# Patient Record
Sex: Female | Born: 1940 | Race: White | Hispanic: No | Marital: Married | Smoking: Never smoker
Health system: Southern US, Community
[De-identification: ages and names within clinical notes are randomized; demographics above are authoritative.]

## PROBLEM LIST (undated history)

## (undated) DIAGNOSIS — M1990 Unspecified osteoarthritis, unspecified site: Secondary | ICD-10-CM

## (undated) DIAGNOSIS — I10 Essential (primary) hypertension: Secondary | ICD-10-CM

## (undated) DIAGNOSIS — K219 Gastro-esophageal reflux disease without esophagitis: Secondary | ICD-10-CM

## (undated) DIAGNOSIS — Z87442 Personal history of urinary calculi: Secondary | ICD-10-CM

## (undated) DIAGNOSIS — E039 Hypothyroidism, unspecified: Secondary | ICD-10-CM

## (undated) DIAGNOSIS — Z8719 Personal history of other diseases of the digestive system: Secondary | ICD-10-CM

## (undated) HISTORY — PX: HERNIA REPAIR: SHX51

## (undated) HISTORY — PX: FOOT SURGERY: SHX648

---

## 2006-01-13 HISTORY — PX: CHOLECYSTECTOMY: SHX55

## 2007-06-09 ENCOUNTER — Other Ambulatory Visit: Payer: Self-pay

## 2007-06-09 ENCOUNTER — Inpatient Hospital Stay: Payer: Self-pay | Admitting: Surgery

## 2007-12-05 ENCOUNTER — Ambulatory Visit: Payer: Self-pay | Admitting: Internal Medicine

## 2008-08-14 ENCOUNTER — Ambulatory Visit: Payer: Self-pay | Admitting: Surgery

## 2008-08-15 ENCOUNTER — Ambulatory Visit: Payer: Self-pay | Admitting: Surgery

## 2008-12-09 ENCOUNTER — Ambulatory Visit: Payer: Self-pay | Admitting: Internal Medicine

## 2009-03-05 ENCOUNTER — Ambulatory Visit: Payer: Self-pay | Admitting: General Practice

## 2009-03-27 ENCOUNTER — Ambulatory Visit: Payer: Self-pay | Admitting: General Practice

## 2009-04-08 ENCOUNTER — Ambulatory Visit: Payer: Self-pay | Admitting: General Practice

## 2009-12-10 ENCOUNTER — Ambulatory Visit: Payer: Self-pay | Admitting: Internal Medicine

## 2010-03-02 ENCOUNTER — Ambulatory Visit: Payer: Self-pay | Admitting: Gastroenterology

## 2010-12-23 ENCOUNTER — Ambulatory Visit: Payer: Self-pay | Admitting: Internal Medicine

## 2012-02-08 ENCOUNTER — Ambulatory Visit: Payer: Self-pay | Admitting: Internal Medicine

## 2012-04-14 DIAGNOSIS — I10 Essential (primary) hypertension: Secondary | ICD-10-CM

## 2012-05-14 ENCOUNTER — Ambulatory Visit: Payer: Self-pay | Admitting: General Practice

## 2012-05-14 LAB — URINALYSIS, COMPLETE
Bacteria: NONE SEEN
Bilirubin,UR: NEGATIVE
Blood: NEGATIVE
Glucose,UR: NEGATIVE mg/dL (ref 0–75)
Ketone: NEGATIVE
Protein: NEGATIVE
Specific Gravity: 1.012 (ref 1.003–1.030)
Squamous Epithelial: 1
Transitional Epi: <1

## 2012-05-14 LAB — CBC
HCT: 30 % — ABNORMAL LOW (ref 35.0–47.0)
HGB: 9.2 g/dL — ABNORMAL LOW (ref 12.0–16.0)
MCV: 76 fL — ABNORMAL LOW (ref 80–100)
Platelet: 322 10*3/uL (ref 150–440)
RBC: 3.94 10*6/uL (ref 3.80–5.20)
RDW: 16.5 % — ABNORMAL HIGH (ref 11.5–14.5)
WBC: 7.1 10*3/uL (ref 3.6–11.0)

## 2012-05-14 LAB — BASIC METABOLIC PANEL
Anion Gap: 8 (ref 7–16)
Calcium, Total: 9.4 mg/dL (ref 8.5–10.1)
Chloride: 103 mmol/L (ref 98–107)
Co2: 29 mmol/L (ref 21–32)
EGFR (African American): >60
EGFR (Non-African Amer.): 55 — ABNORMAL LOW
Osmolality: 279 (ref 275–301)
Potassium: 3.8 mmol/L (ref 3.5–5.1)

## 2012-05-14 LAB — PROTIME-INR
INR: 0.9
Prothrombin Time: 12.6 secs (ref 11.5–14.7)

## 2012-05-14 LAB — SEDIMENTATION RATE: Erythrocyte Sed Rate: 30 mm/hr (ref 0–30)

## 2012-07-17 ENCOUNTER — Ambulatory Visit: Payer: Self-pay | Admitting: Gastroenterology

## 2012-08-27 ENCOUNTER — Ambulatory Visit: Payer: Self-pay | Admitting: General Practice

## 2012-08-27 LAB — BASIC METABOLIC PANEL
Co2: 29 mmol/L (ref 21–32)
Creatinine: 0.87 mg/dL (ref 0.60–1.30)
EGFR (African American): >60
Glucose: 84 mg/dL (ref 65–99)
Potassium: 3.9 mmol/L (ref 3.5–5.1)
Sodium: 140 mmol/L (ref 136–145)

## 2012-08-27 LAB — CBC
HCT: 37.7 % (ref 35.0–47.0)
HGB: 12.6 g/dL (ref 12.0–16.0)
MCH: 28.5 pg (ref 26.0–34.0)
MCHC: 33.4 g/dL (ref 32.0–36.0)
MCV: 85 fL (ref 80–100)
RDW: 21.6 % — ABNORMAL HIGH (ref 11.5–14.5)

## 2012-08-27 LAB — URINALYSIS, COMPLETE
Blood: NEGATIVE
Ketone: NEGATIVE
Nitrite: NEGATIVE
Ph: 6 (ref 4.5–8.0)
Protein: NEGATIVE
RBC,UR: 1 /HPF (ref 0–5)
Specific Gravity: 1.006 (ref 1.003–1.030)

## 2012-08-27 LAB — PROTIME-INR
INR: 0.9
Prothrombin Time: 12.7 secs (ref 11.5–14.7)

## 2012-08-28 LAB — URINE CULTURE

## 2012-09-12 ENCOUNTER — Inpatient Hospital Stay: Payer: Self-pay | Admitting: General Practice

## 2012-09-13 LAB — BASIC METABOLIC PANEL
BUN: 7 mg/dL (ref 7–18)
Calcium, Total: 8.3 mg/dL — ABNORMAL LOW (ref 8.5–10.1)
Chloride: 107 mmol/L (ref 98–107)
Co2: 27 mmol/L (ref 21–32)
Glucose: 118 mg/dL — ABNORMAL HIGH (ref 65–99)
Osmolality: 277 (ref 275–301)
Potassium: 3.7 mmol/L (ref 3.5–5.1)
Sodium: 139 mmol/L (ref 136–145)

## 2012-09-13 LAB — PLATELET COUNT: Platelet: 184 10*3/uL (ref 150–440)

## 2012-09-14 LAB — BASIC METABOLIC PANEL
Chloride: 106 mmol/L (ref 98–107)
Co2: 29 mmol/L (ref 21–32)
Creatinine: 0.82 mg/dL (ref 0.60–1.30)
EGFR (African American): >60
EGFR (Non-African Amer.): >60
Glucose: 100 mg/dL — ABNORMAL HIGH (ref 65–99)
Osmolality: 278 (ref 275–301)

## 2012-09-14 LAB — HEMOGLOBIN: HGB: 12.2 g/dL (ref 12.0–16.0)

## 2013-02-11 ENCOUNTER — Ambulatory Visit: Payer: Self-pay | Admitting: Internal Medicine

## 2013-05-13 ENCOUNTER — Ambulatory Visit: Payer: Self-pay | Admitting: General Practice

## 2013-06-20 ENCOUNTER — Ambulatory Visit: Payer: Self-pay | Admitting: General Practice

## 2013-06-28 ENCOUNTER — Ambulatory Visit: Payer: Self-pay | Admitting: General Practice

## 2014-03-25 ENCOUNTER — Ambulatory Visit: Payer: Self-pay | Admitting: Internal Medicine

## 2015-02-10 NOTE — Op Note (Signed)
PATIENT NAME:  Susan Khan, Susan Khan MR#:  [id number] DATE OF BIRTH:  [birth year]  DATE OF PROCEDURE:  09/12/2012  PREOPERATIVE DIAGNOSIS: Degenerative arthrosis of the left knee.   POSTOPERATIVE DIAGNOSIS: Degenerative arthrosis of the left knee.   PROCEDURE PERFORMED: Left total knee arthroplasty using computer-assisted navigation.   SURGEON: Illene LabradorJames P. Angie FavaHooten Jr., MD  ASSISTANT: Van ClinesJon Wolfe, PA-C (required to maintain retraction throughout the procedure)   ANESTHESIA: Femoral nerve block and spinal.   ESTIMATED BLOOD LOSS: 50 mL.   FLUIDS REPLACED: 1800 mL of crystalloid.   TOURNIQUET TIME: 77 minutes.   DRAINS: Two medium drains to reinfusion system.   SOFT TISSUE RELEASES: Anterior cruciate ligament, posterior cruciate ligament, deep medial collateral ligament, and patellofemoral ligament.   IMPLANTS UTILIZED: DePuy PFC Sigma size 2.5 posterior stabilized femoral component (cemented), size 2 MBT tibial component (cemented), 35 mm three peg oval dome patella (cemented), and a 10 mm stabilized rotating platform polyethylene insert.   INDICATIONS FOR SURGERY: The patient is a 74 year old female who has been seen for complaints of progressive left knee pain. X-rays demonstrated severe degenerative changes with relative varus deformity. After discussion of the risks and benefits of surgical intervention, the patient expressed her understanding of the risks and benefits and agreed with plans for surgical intervention.   PROCEDURE IN DETAIL: Patient was brought into the Operating Room and, after adequate femoral nerve block and spinal anesthesia was achieved, a tourniquet was placed on the patient's upper left thigh. Patient's left knee and leg were cleaned and prepped with alcohol and DuraPrep and draped in the usual sterile fashion. A "timeout" was performed as per usual protocol. The left lower extremity was exsanguinated using Esmarch, and the tourniquet was inflated to 300 mmHg. Anterior  longitudinal incision was made followed by a standard mid vastus approach. A moderate effusion was evacuated. The deep fibers of the medial collateral ligament were elevated in subperiosteal fashion off the medial flare of the tibia so as to maintain a continuous soft tissue sleeve. Patella was subluxed laterally and the patellofemoral ligament was incised. Inspection of the knee demonstrated severe degenerative changes with full thickness loss of articular cartilage most notably to the medial compartment. Prominent osteophytes were debrided using rongeur. Anterior and posterior cruciate ligaments were excised. Two 4.0 mm Schanz pins were inserted into the femur and into the tibia for attachment of the ray of trackers used for computer-assisted navigation. Hip center was identified using circumduction technique. Distal landmarks were mapped using computer. Distal femur and proximal tibia were mapped using computer. Distal femoral cutting guide was positioned using computer-assisted navigation so as to achieve a 5 degrees distal valgus cut. Cut was performed and verified using the computer. Distal femur was sized and it was felt that a size 2.5 femoral component was appropriate. A size 2.5 cutting guide was positioned and the anterior cut was performed and verified using computer. This was followed by completion of the posterior and chamfer cuts. Femoral cutting guide for the central box was then positioned and the central box cut was performed.   Attention was then directed to the proximal tibia. Medial and lateral menisci were excised. The extramedullary tibial cutting guide was positioned using computer-assisted navigation so as to achieve a 0 degrees varus valgus alignment and 0 degrees posterior slope. Cut was performed and verified using computer. Proximal tibia was sized and it was felt that a size 2 tibial tray was appropriate. Tibial and femoral trials were inserted followed by insertion of a 10  mm  polyethylene trial. Excellent mediolateral soft tissue balancing was appreciated both in full extension and in flexion. Finally, the patella was cut and prepared so as to accommodate a 35 mm three peg oval dome patella. Patellar trial was placed and the knee was placed through a range of motion with excellent patellar tracking appreciated.   Femoral trial was removed. Central post hole for the tibial component was reamed followed by insertion of a keel punch. Tibial trials were then removed. Cut surfaces of bone were irrigated with copious amounts of normal saline with antibiotic solution using pulsatile lavage and then suctioned dry. Polymethyl methacrylate cement was prepared in the usual fashion using a vacuum mixer. Cement was applied to the cut surface of the proximal tibia as well as along the undersurface of size 2 MBT tibial component. Tibial component was positioned and impacted into place. Excess cement was removed using freer elevators. Cement was then applied to the cut surface of the femur as well as along the posterior flanges of a size 2.5 posterior stabilized femoral component. Femoral component was positioned and impacted into place. Excess cement was removed using freer elevators. A 10 mm polyethylene trial was inserted and the knee was brought into full extension with steady axial compression applied. Finally, cement was applied to the backside of a 35 mm three peg oval dome patella and patellar component was positioned and patellar clamp applied. Excess cement was removed using freer elevators.   After adequate curing of cement, tourniquet was deflated after total tourniquet time of 77 minutes. Hemostasis was achieved using electrocautery. The knee was irrigated with copious amounts of normal saline with antibiotic solution using pulsatile lavage and then suctioned dry. The knee was inspected for any residual cement debris. 30 mL of 0.25% Marcaine with epinephrine was injected along the  posterior capsule. A 10 mm stabilized rotating platform polyethylene insert was inserted and the knee was placed through a range of motion. Excellent patellar tracking was appreciated and excellent mediolateral soft tissue balancing was noted both in extension and in flexion. Two medium drains were placed in the wound bed and brought out through a separate stab incision to be attached to reinfusion system. The medial parapatellar portion of the incision was reapproximated using interrupted sutures of #1 Vicryl. Subcutaneous tissue was approximated in layers using first #0 Vicryl followed by 2-0 Vicryl. Skin was closed with skin staples. A sterile dressing was applied.   Patient tolerated procedure well. She was transported to the recovery room in stable condition.   ____________________________ Illene Labrador. Angie Fava., MD jph:cms D: 09/13/2012 06:21:20 ET T: 09/13/2012 09:29:04 ET JOB#: [id number]  cc: Illene Labrador. Angie Fava., MD, <Dictator>  Illene Labrador Angie Fava MD ELECTRONICALLY SIGNED 09/14/2012 17:59

## 2015-02-10 NOTE — Discharge Summary (Signed)
PATIENT NAME:  Susan Khan, Candiss W MR#:  [id number] DATE OF BIRTH:  02-18-1941  DATE OF ADMISSION:  09/12/2012 DATE OF DISCHARGE:  09/15/2012  ADMITTING DIAGNOSIS: Degenerative arthrosis of left knee.   DISCHARGE DIAGNOSIS: Degenerative arthrosis of left knee.   HISTORY: Patient is a 74 year old pleasant female who has been [facility] for progression of left knee discomfort. Patient was tentatively scheduled to undergo surgery in August but secondary to a decreased hemoglobin this was cancelled until this time. She did have a work-up including colonoscopy. There was no source of decrease in hemoglobin. It was felt to be secondary to her taking NSAIDs and aspirin use. Once she stopped these her hemoglobin returned to normal level.   She states that she had had a remote history of left knee arthroscopy for a meniscectomy as well as chondroplasty. She has had significant increase in her right knee pain over the last several months with episodes of giving way as well as night pain. Patient had localized most of the pain along the medial aspect of the knee. Prior to surgery she was not using any ambulatory aid. Her pain was noted be aggravated with standing and weight-bearing activities. Her internist had requested that she avoid nonsteroidal medications due to the history of gastroesophageal reflux disease. The patient had not seen any significant improvement in her condition despite cortisone injections as well as activity modification. The pain had gotten to the point that it was significantly interfering with her activities of daily living. [facility]  (dictation cut off here) remainder dictated on a different note secondary to this dictation being interupted.  ____________________________ Van ClinesJon Minela [place], PA jrw:cms D: 09/14/2012 07:54:00 ET T: 09/14/2012 12:19:55 ET JOB#: [id number]  cc: Van ClinesJon Nivin Braniff, PA, <Dictator>  Doroteo Nickolson PA ELECTRONICALLY SIGNED  09/22/2012 19:37

## 2015-02-10 NOTE — Consult Note (Signed)
PATIENT NAME:  Susan Khan, Susan Khan MR#:  [id number] DATE OF BIRTH:  [birth year]  DATE OF CONSULTATION:  09/14/2012  REFERRING PHYSICIAN:   CONSULTING PHYSICIAN:  Ramonita Lab, MD  REASON FOR CONSULTATION: Elevated blood pressure.  PRIMARY CARE PHYSICIAN: Dr. Daniel Nones form [facility]  ATTENDING PHYSICIAN: Dr. Claris Gladden from orthopedics    HISTORY OF PRESENT ILLNESS: Patient is a 74 year old pleasant Caucasian female with a past medical history of hyperlipidemia, hypothyroidism, hypertension, gastroesophageal reflux disease and shingles was admitted to orthopedic service for left knee pain and for re-evaluation of the left knee. She was admitted under orthopedic service on 11/20 and she had a left knee replacement done on 11/20. Today is postop day # and patient is tolerating p.o. fine and is drinking plenty of fluids. Hospitalist team is called regarding elevated blood pressure. During my examination patient is resting comfortably, denies any headache or chest pain or shortness of breath. She is reporting that she has past medical history of hypertension and she used to take Zestril which was discontinued by her primary care physician as her blood pressure was normal after she stopped working. She also has mentioned that she was not taking any blood pressure pills for the past 10 years. She is denying any pain in the left knee area but she is feeling that it is tight as it was wrapped in a bandage. Denies any other complaints. She is reporting that her pain is comfortable with the current pain management regimen.   PAST MEDICAL HISTORY: 1. Hypertension, not on any medications. 2. Hypothyroidism. 3. Hyperlipidemia. 4. Gastroesophageal reflux disease. 5. Shingles.    PAST SURGICAL HISTORY:  1. Tonsillectomy. 2. Herniorrhaphy. 3. Left knee replacement done 11/20. 4. Right bunionectomy. 5. Cholecystectomy. 6. Left knee arthroscopy, partial lateral meniscectomy and chondroplasty of  the patellofemoral compartment in June 2010.   PSYCHOSOCIAL HISTORY: Lives with husband. Denies smoking, alcohol or illicit drug usage.   FAMILY HISTORY: Mother had history of chronic obstructive pulmonary disease and breast cancer.   REVIEW OF SYSTEMS: CONSTITUTIONAL: Denies any fatigue, weakness, tiredness. No weight loss or weight gain. HEENT: Denies any blurry vision. Denies any jaundice. Denies any headache or blurry vision. RESPIRATORY: Patient denies any pneumonia, cough, shortness of breath. CARDIO: Patient denies any chest pain, palpitations, arrhythmia, passing out. GASTRO: Patient denies any diarrhea, abdominal pain, constipation. No hematemesis. No bloody stool. NEUROLOGICAL: Denies any history of stroke. Denies any weakness. Denies any passing out. Denies any headache. MUSCULOSKELETAL: Complaining of left knee pain. Denies any back pain or neck pain. SKIN: Denies any cancer, jaundice, lesions. HEMATOLOGIC/LYMPHATIC: Denies any history of anemia, bleeding, cancers. No swollen glands.   PHYSICAL EXAMINATION:  VITAL SIGNS: Blood pressure was initially 186/120 the time of consult and after getting Lopressor 10 mg IV it went down to 162/89 with a pulse of 113, temperature 97.8, pulse 74 to 86, pulse oximetry 97% to 98%, respiratory rate 18 to 20.   GENERAL APPEARANCE: Not in acute distress, resting comfortably.  HEENT: Normocephalic, atraumatic. Pupils are equally reacting to light and accommodation. Moist mucous membranes.   NECK: Supple. No JVD. No carotid bruits. No thyromegaly.   LUNGS: Clear to auscultation bilaterally. No crackles. No wheezing. No rhonchi.   CARDIAC: S1, S2 normal. Regular rate and rhythm. No murmurs. No gallops.   GASTROINTESTINAL: Soft. Bowel sounds are positive in all four quadrants. Nontender, nondistended.   NEUROLOGIC: Awake, alert and oriented x3. Motor and sensory are grossly intact. Tone is normal. Reflexes are  2+.   EXTREMITIES: Left knee is in a  clean bandage. Distal pulses are 2+.   SKIN: No rashes. No lesions. No jaundice. No cyanosis. No clubbing.   LABORATORY, DIAGNOSTIC AND RADIOLOGICAL DATA: From 09/13/2012: Glucose 118, sodium 139, potassium 3.7, chloride 107, CO2 27, anion gap 5, BUN 7, creatinine 0.75, hemoglobin 11.9, platelet count 184,000.   ASSESSMENT AND PLAN: 74 year old female was admitted for left knee replacement and hospitalist team is consulted regarding elevated blood pressure.  1. Hypertensive urgency. Will give her Lopressor 5 mg IV q.6 hours as needed for elevated systolic blood pressure greater than 150. Will start her on low dose metoprolol 25 mg p.o. b.i.d. and titrate as needed. Check BMP in a.m.  2. Status post left knee replacement, postop day #1. Pain management per ortho.   3. History of hypothyroidism. Continue Synthroid.  4. Hyperlipidemia. Continue statin.   The plan of care was discussed in detail with the patient. She is aware of the plan. As patient is eating and drinking well her fluids.   Thank you Dr. Claris Gladdenamasunder for allowing hospitalist team to take care of this patient.   TOTAL TIME SPENT ON THE CONSULTATION: 45 minutes.   ____________________________ Ramonita LabAruna Keiland Pickering, MD ag:cms D: 09/14/2012 01:47:59 ET T: 09/14/2012 10:10:27 ET JOB#: [id number]  cc: Ramonita LabAruna Abrial Arrighi, MD, <Dictator> Lynnea FerrierBert J. Klein III, MD Murlean HarkShalini Ramasunder, MD  Ramonita LabARUNA Vertis Scheib MD ELECTRONICALLY SIGNED 09/19/2012 6:15

## 2015-02-10 NOTE — Discharge Summary (Signed)
PATIENT NAME:  Susan Khan, Susan Khan MR#:  [id number] DATE OF BIRTH:  [birth year]  DATE OF ADMISSION:  09/12/2012 DATE OF DISCHARGE:  09/14/2012  ADDENDUM: Continuation of dictation.  RESULTS: [facility] showed significant narrowing to the medial compartment space. She was noted to have patellofemoral degenerative changes with spurring being noted to the posterior, superior, and inferior poles of the patella. However, the femur appeared to be riding significantly posterior in relation to the tibia, on the lateral view. After discussion of the risks and benefits of surgical intervention, the patient expressed her understanding of the risks and benefits and agreed with plans for surgical intervention.   PROCEDURE: Left total knee arthroplasty using computer-assisted navigation.   ANESTHESIA: Femoral nerve block with spinal.   SOFT TISSUE RELEASE: Anterior cruciate ligament, posterior cruciate ligament, and deep medial collateral ligaments, as well as the patellofemoral ligament.   IMPLANTS UTILIZED: DePuy PFC Sigma size 2.5 posterior stabilized femoral component (cemented), size 2 MBT tibial component (cemented), 35 mm three pegged oval dome patella (cemented), and a 10 mm stabilized rotating platform polyethylene insert.   HOSPITAL COURSE: The patient tolerated the procedure very well. She had no complications. She was then taken to the PAC-U where she was stabilized and then transferred to the orthopedic floor. The patient began receiving anticoagulation therapy of Lovenox 30 mg subcutaneous every 12 hours per anesthesia protocol. She was fitted with TED stockings bilaterally. These were allowed to be removed one hour per eight hour shift. The left one was applied on day two following removal of the Hemovac and dressing change. Her calves have been nontender. There has been no evidence of any DVTs to the lower extremity. Her heels were elevated off the bed using rolled  towels.   The patient has denied any chest pain or shortness of breath. Vital signs have been stable. She has been afebrile. Hemodynamically she was stable. However, during the hospital course, the first night, she was noted to have some elevation of blood pressure. The hospitalist was contacted and she was placed on Lopressor as well as Cozaar. She had been on blood pressure medicine in the past but due to this being under control this was stopped a few years ago. Her family practitioner, Dr. Daniel NonesBert Klein, also consulted the patient. She was asymptomatic during this timeframe.  She had no chest pain or shortness of breath.   Physical therapy was initiated on day one for gait training and transfers. She was ambulating greater than 200 feet upon being discharged. She was able go up and down four sets of steps. She was independent with bed to chair transfers. Occupational therapy was also initiated on day one for activities of daily living and assisted devices.   The patient's IV, Foley, and Hemovac were all discontinued on day two along with a dressing change. The wound was free of any drainage or signs of infection. The Polar Care was reapplied to the surgical leg maintaining a temperature of 40 to 50 degrees Fahrenheit.   DISPOSITION: The patient is being discharged to home in improved stable condition.   DISCHARGE INSTRUCTIONS: She may weight bear as tolerated. Continue using a walker until cleared by physical therapy to go to a quad cane. She will receive home health physical therapy. Continue TED stockings. These are to be worn during the day but may be removed at night. Continue Polar Care maintaining a temperature of 40 to 50 degrees Fahrenheit. She is placed on a regular diet.  She is to return to the clinic in two weeks, sooner if any temperatures of 101.5 or greater or excessive bleeding. She is to resume her regular medication that she was on prior to his admission. She was given a prescription for  oxycodone 5 to 10 mg every 4 to 6 hours p.r.n. for pain, Ultram 50 to 100 mg every 4 to 6 hours p.r.n. for pain, and Lovenox 40 mg daily for two weeks and then discontinue and begin taking one 81 mg enteric-coated aspirin.   PAST MEDICAL HISTORY:  1. Hyperlipidemia.  2. Hypothyroidism.  3. Hypertension.          4. Gastroesophageal reflux disease.  5. Shingles. ____________________________ Van Clines, PA jrw:slb D: 09/14/2012 08:00:08 ET T: 09/14/2012 12:38:38 ET JOB#: [id number]  cc: Van Clines, PA, <Dictator> Matej Sappenfield PA ELECTRONICALLY SIGNED 09/22/2012 19:37

## 2015-02-13 NOTE — Op Note (Signed)
PATIENT NAME:  Susan Khan, Susan Khan MR#:  [id number] DATE OF BIRTH:  18-Oct-1941  DATE OF PROCEDURE:  06/28/2013  PREOPERATIVE DIAGNOSIS: Internal derangement of the right knee.   POSTOPERATIVE DIAGNOSES: 1.  Tear of the posterior horn of the medial meniscus, right knee.  2.  Tear of the posterior horn of the lateral meniscus, right knee.  3.  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   PROCEDURE PERFORMED: Right knee arthroscopy, partial medial and lateral meniscectomies and chondroplasty of the medial and patellofemoral compartments.   SURGEON: Illene Labrador. Hooten, M.D.   ANESTHESIA: General.   ESTIMATED BLOOD LOSS: Minimal.   FLUIDS REPLACED:  700 mL of crystalloid.   DRAINS: None.   TOURNIQUET TIME: Not used.   INDICATIONS FOR SURGERY: The patient is a 74 year old female who has been seen for complaints of persistent right knee pain. MRI demonstrated findings consistent with meniscal pathology. After discussion of the risks and benefits of surgical intervention, the patient expressed understanding of the risks and benefits and agreed with plans for surgical intervention.   PROCEDURE IN DETAIL: The patient was brought into the operating room and after adequate general anesthesia was achieved, a tourniquet was placed on the patient's right thigh and leg was placed in a leg holder. All bony prominences were well padded. The patient's right knee and leg were cleaned and prepped with alcohol and DuraPrep, draped in the usual sterile fashion. A "timeout" was performed as per usual protocol. The anticipated portal sites were injected with 0.25% Marcaine with epinephrine. Anterolateral portal was created and a cannula was inserted. Large effusion was evacuated. The scope was inserted and the knee was distended with fluid using pump. The scope was advanced down the medial gutter into the medial compartment of the knee. Under visualization with the scope, an anteromedial  portal was created and hook probe was inserted. Inspection of the medial compartment demonstrated a degenerative tear of the posterior horn of the medial meniscus. The tear was debrided using meniscal punches and a 4.5 mm shaver. Final contouring of the posterior horn was performed using a 50 degrees ArthroCare wand. The remaining rim of meniscus was visualized and probed and felt to be stable. The anterior horn of the medial meniscus was also probed and felt to be stable. There were grade 3 changes of chondromalacia, involving primarily the medial femoral condyle with lesser changes to the medial tibial plateau. These areas were debrided and contoured using the ArthroCare wand. The scope was then advanced into the intracondylar region. The anterior cruciate ligament was visualized and probed and felt to be stable. The scope was removed from the anterolateral portal and reinserted via the anteromedial portal so as to better visualize the lateral compartment. The articular cartilage was visualized and felt to be in good condition. There was a tear involving the posterior horn of the lateral meniscus which was debrided using meniscal punches and a 4.5 mm shaver with contouring performed using the ArthroCare wand. The anterior horn was visualized and probed and only mild fraying was noted, and this was debrided using the ArthroCare wand. Finally, the scope was positioned so as to visualize the patellofemoral articulation. Areas of grade 3 chondromalacia were noted to the facets of the patella with lesser changes to the trochlear groove. These areas were debrided and contoured using the ArthroCare wand. Good patellar tracking was noted.   The knee was irrigated with copious amounts of fluid and then suctioned dry. The anterolateral portal was reapproximated using #  3-0 nylon. A combination of 0.25% Marcaine with epinephrine and 4 mg morphine was injected via the scope. The scope was removed and the anteromedial portal  was reapproximated using #3-0 nylon. A sterile dressing was applied followed by application of ice wrap.   The patient tolerated the procedure well. She was transported to the recovery room in stable condition.   ____________________________ Illene LabradorJames P. Angie FavaHooten Jr., MD jph:nts D: 06/28/2013 23:35:29 ET T: 06/29/2013 03:59:28 ET JOB#: [id number]  cc: Illene LabradorJames P. Angie FavaHooten Jr., MD, <Dictator> JAMES P Angie FavaHOOTEN JR MD ELECTRONICALLY SIGNED 07/03/2013 7:16

## 2015-03-11 DIAGNOSIS — E039 Hypothyroidism, unspecified: Secondary | ICD-10-CM | POA: Diagnosis not present

## 2015-03-11 DIAGNOSIS — D649 Anemia, unspecified: Secondary | ICD-10-CM | POA: Diagnosis not present

## 2015-03-11 DIAGNOSIS — I10 Essential (primary) hypertension: Secondary | ICD-10-CM | POA: Diagnosis not present

## 2015-03-11 DIAGNOSIS — K219 Gastro-esophageal reflux disease without esophagitis: Secondary | ICD-10-CM | POA: Diagnosis not present

## 2015-03-11 DIAGNOSIS — E785 Hyperlipidemia, unspecified: Secondary | ICD-10-CM | POA: Diagnosis not present

## 2015-03-18 ENCOUNTER — Other Ambulatory Visit: Payer: Self-pay | Admitting: Internal Medicine

## 2015-03-18 DIAGNOSIS — Z0001 Encounter for general adult medical examination with abnormal findings: Secondary | ICD-10-CM | POA: Diagnosis not present

## 2015-03-18 DIAGNOSIS — K219 Gastro-esophageal reflux disease without esophagitis: Secondary | ICD-10-CM | POA: Diagnosis not present

## 2015-03-18 DIAGNOSIS — E038 Other specified hypothyroidism: Secondary | ICD-10-CM | POA: Diagnosis not present

## 2015-03-18 DIAGNOSIS — I10 Essential (primary) hypertension: Secondary | ICD-10-CM | POA: Diagnosis not present

## 2015-03-18 DIAGNOSIS — D649 Anemia, unspecified: Secondary | ICD-10-CM | POA: Diagnosis not present

## 2015-03-18 DIAGNOSIS — Z1231 Encounter for screening mammogram for malignant neoplasm of breast: Secondary | ICD-10-CM

## 2015-04-08 ENCOUNTER — Ambulatory Visit
Admission: RE | Admit: 2015-04-08 | Discharge: 2015-04-08 | Disposition: A | Discharge status: Home / Self Care | Payer: Commercial Managed Care - HMO | Source: Ambulatory Visit | Attending: Internal Medicine | Admitting: Internal Medicine

## 2015-04-08 DIAGNOSIS — Z1231 Encounter for screening mammogram for malignant neoplasm of breast: Secondary | ICD-10-CM | POA: Insufficient documentation

## 2015-04-10 ENCOUNTER — Other Ambulatory Visit: Payer: Self-pay | Admitting: Internal Medicine

## 2015-04-10 DIAGNOSIS — R928 Other abnormal and inconclusive findings on diagnostic imaging of breast: Secondary | ICD-10-CM

## 2015-04-22 ENCOUNTER — Ambulatory Visit: Payer: Commercial Managed Care - HMO

## 2015-04-22 ENCOUNTER — Ambulatory Visit
Admission: RE | Admit: 2015-04-22 | Discharge: 2015-04-22 | Disposition: A | Discharge status: Home / Self Care | Payer: Commercial Managed Care - HMO | Source: Ambulatory Visit | Attending: Internal Medicine | Admitting: Internal Medicine

## 2015-04-22 DIAGNOSIS — N6489 Other specified disorders of breast: Secondary | ICD-10-CM | POA: Diagnosis present

## 2015-04-22 DIAGNOSIS — R928 Other abnormal and inconclusive findings on diagnostic imaging of breast: Secondary | ICD-10-CM | POA: Insufficient documentation

## 2015-04-30 DIAGNOSIS — M1711 Unilateral primary osteoarthritis, right knee: Secondary | ICD-10-CM | POA: Diagnosis not present

## 2015-04-30 DIAGNOSIS — G8929 Other chronic pain: Secondary | ICD-10-CM | POA: Diagnosis not present

## 2015-04-30 DIAGNOSIS — M5416 Radiculopathy, lumbar region: Secondary | ICD-10-CM | POA: Diagnosis not present

## 2015-04-30 DIAGNOSIS — M25561 Pain in right knee: Secondary | ICD-10-CM | POA: Diagnosis not present

## 2015-05-11 ENCOUNTER — Other Ambulatory Visit: Payer: Self-pay | Admitting: Orthopedic Surgery

## 2015-05-11 DIAGNOSIS — M5416 Radiculopathy, lumbar region: Secondary | ICD-10-CM

## 2015-05-18 ENCOUNTER — Ambulatory Visit
Admission: RE | Admit: 2015-05-18 | Discharge: 2015-05-18 | Disposition: A | Discharge status: Home / Self Care | Payer: Commercial Managed Care - HMO | Source: Ambulatory Visit | Attending: Orthopedic Surgery | Admitting: Orthopedic Surgery

## 2015-05-18 DIAGNOSIS — M47896 Other spondylosis, lumbar region: Secondary | ICD-10-CM | POA: Diagnosis not present

## 2015-05-18 DIAGNOSIS — M5137 Other intervertebral disc degeneration, lumbosacral region: Secondary | ICD-10-CM | POA: Diagnosis not present

## 2015-05-18 DIAGNOSIS — M5387 Other specified dorsopathies, lumbosacral region: Secondary | ICD-10-CM | POA: Insufficient documentation

## 2015-05-18 DIAGNOSIS — S33100A Subluxation of unspecified lumbar vertebra, initial encounter: Secondary | ICD-10-CM | POA: Diagnosis not present

## 2015-05-18 DIAGNOSIS — M5386 Other specified dorsopathies, lumbar region: Secondary | ICD-10-CM | POA: Diagnosis not present

## 2015-05-18 DIAGNOSIS — M5136 Other intervertebral disc degeneration, lumbar region: Secondary | ICD-10-CM | POA: Insufficient documentation

## 2015-05-18 DIAGNOSIS — M5416 Radiculopathy, lumbar region: Secondary | ICD-10-CM

## 2015-05-18 DIAGNOSIS — M79604 Pain in right leg: Secondary | ICD-10-CM | POA: Diagnosis present

## 2015-05-18 DIAGNOSIS — M47817 Spondylosis without myelopathy or radiculopathy, lumbosacral region: Secondary | ICD-10-CM | POA: Diagnosis not present

## 2015-05-18 DIAGNOSIS — M4317 Spondylolisthesis, lumbosacral region: Secondary | ICD-10-CM | POA: Diagnosis not present

## 2015-05-18 DIAGNOSIS — M4806 Spinal stenosis, lumbar region: Secondary | ICD-10-CM | POA: Diagnosis not present

## 2015-05-21 DIAGNOSIS — M5416 Radiculopathy, lumbar region: Secondary | ICD-10-CM | POA: Diagnosis not present

## 2015-08-26 DIAGNOSIS — Z23 Encounter for immunization: Secondary | ICD-10-CM | POA: Diagnosis not present

## 2015-09-16 DIAGNOSIS — I10 Essential (primary) hypertension: Secondary | ICD-10-CM | POA: Diagnosis not present

## 2015-09-16 DIAGNOSIS — D649 Anemia, unspecified: Secondary | ICD-10-CM | POA: Diagnosis not present

## 2015-09-23 DIAGNOSIS — I10 Essential (primary) hypertension: Secondary | ICD-10-CM | POA: Diagnosis not present

## 2015-09-23 DIAGNOSIS — D6489 Other specified anemias: Secondary | ICD-10-CM | POA: Diagnosis not present

## 2015-09-23 DIAGNOSIS — K219 Gastro-esophageal reflux disease without esophagitis: Secondary | ICD-10-CM | POA: Diagnosis not present

## 2015-09-23 DIAGNOSIS — M5136 Other intervertebral disc degeneration, lumbar region: Secondary | ICD-10-CM | POA: Insufficient documentation

## 2015-09-23 DIAGNOSIS — E784 Other hyperlipidemia: Secondary | ICD-10-CM | POA: Diagnosis not present

## 2015-09-23 DIAGNOSIS — M150 Primary generalized (osteo)arthritis: Secondary | ICD-10-CM | POA: Diagnosis not present

## 2015-09-23 DIAGNOSIS — E034 Atrophy of thyroid (acquired): Secondary | ICD-10-CM | POA: Diagnosis not present

## 2015-11-29 IMAGING — MG MM DIGITAL SCREENING BILAT W/ CAD
2 series · 7 of 7 positions shown · non-contrast
Comparison: Previous exam(s).

CLINICAL DATA: Screening.

EXAM:
DIGITAL SCREENING BILATERAL MAMMOGRAM WITH CAD

[R CC · right · 6 of 6 slices shown]
[im 1/6]
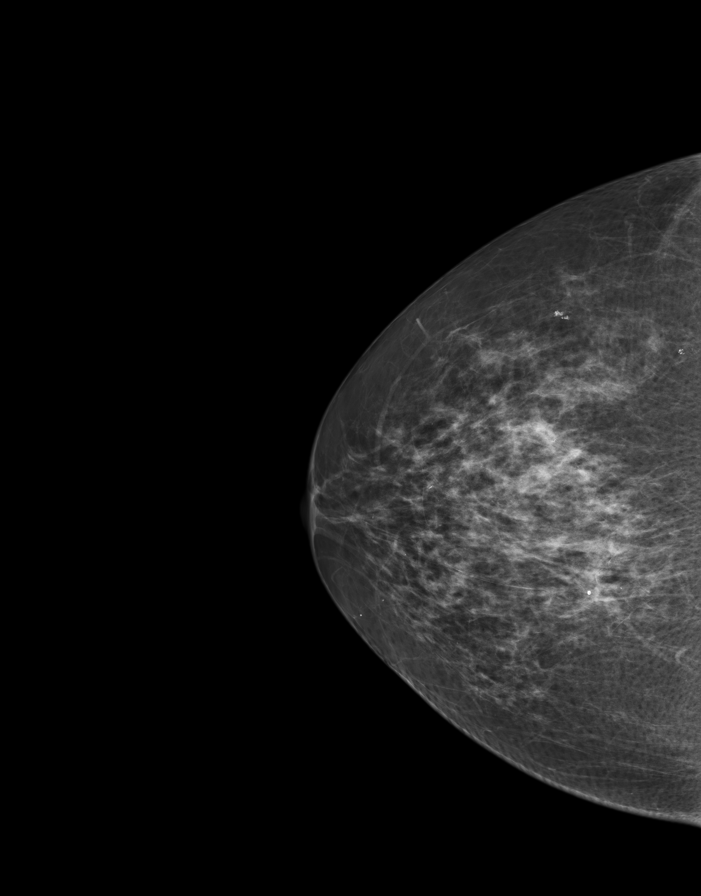
[im 2/6]
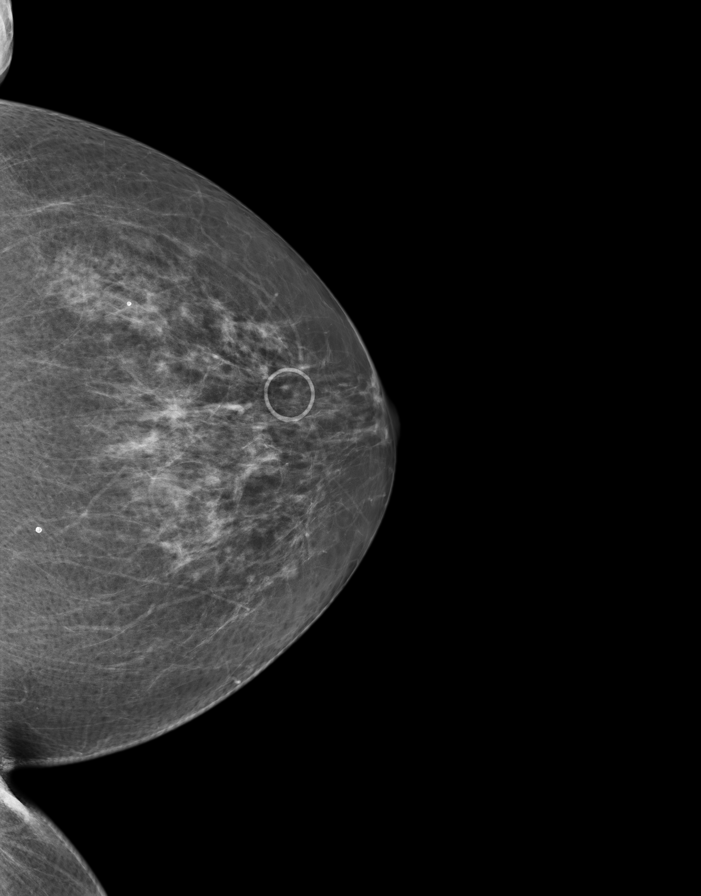
[im 3/6]
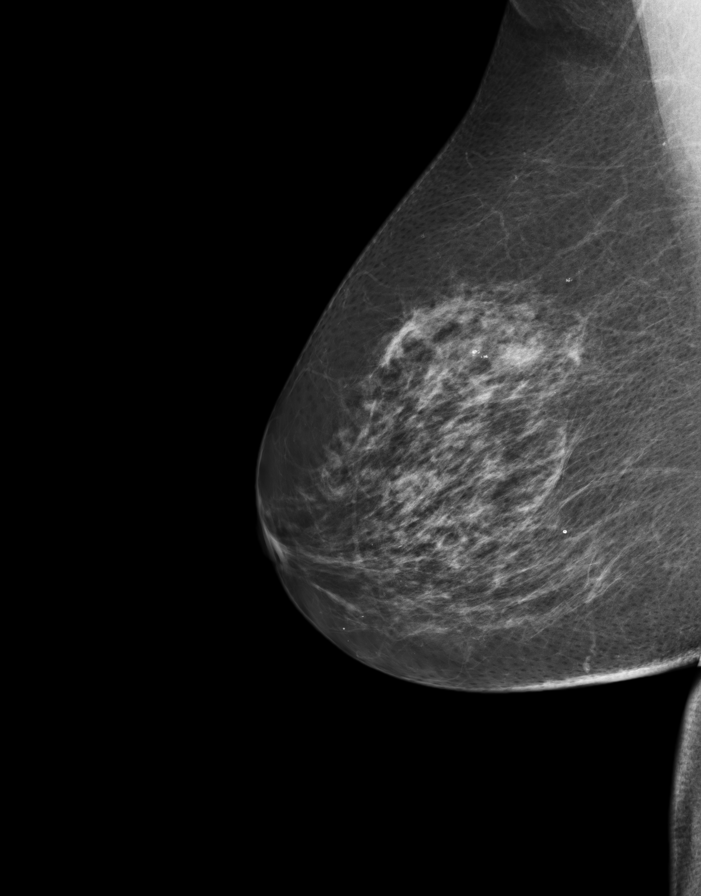
[im 4/6]
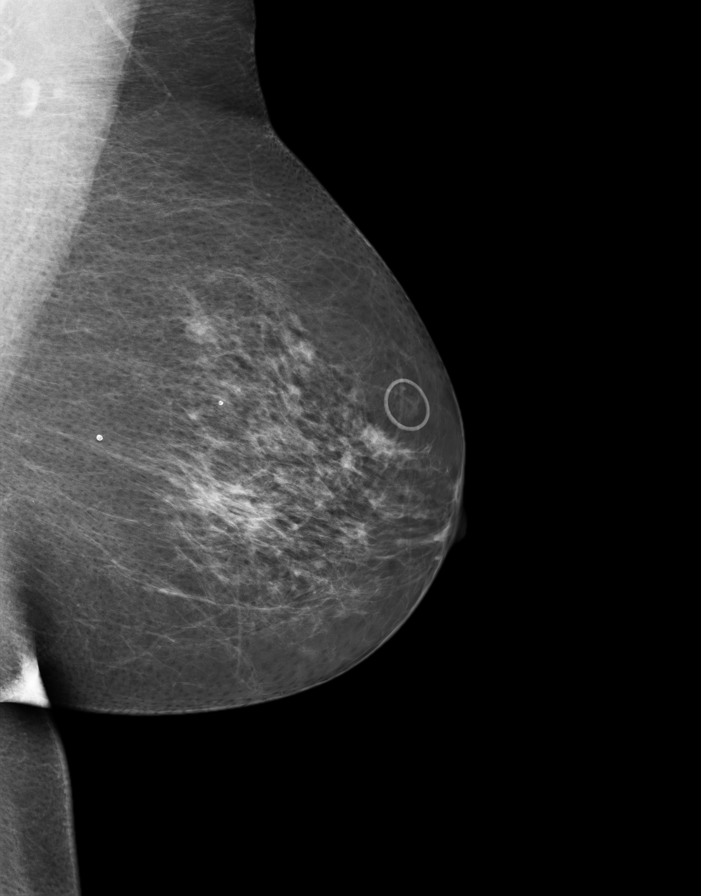
[im 5/6]
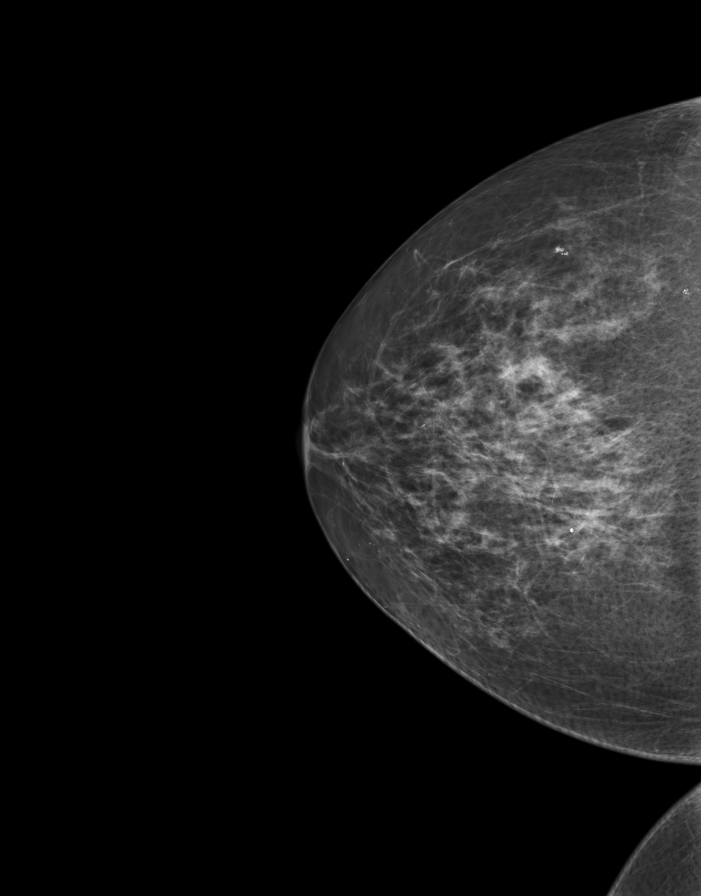
[im 6/6]
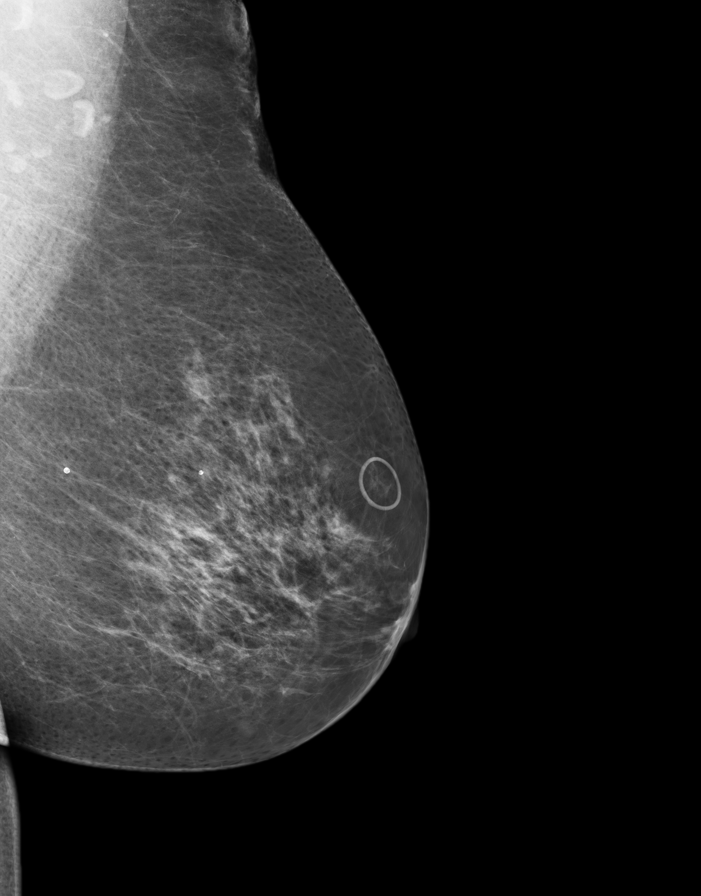

[L CC]
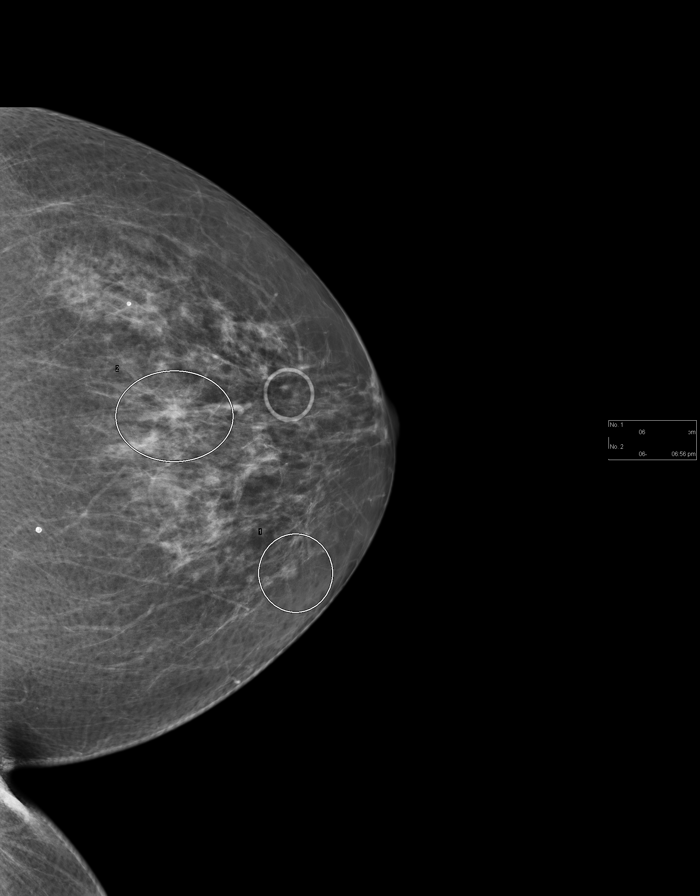

[7 of 7 positions shown; findings below may reference images not displayed]

ACR Breast Density Category b: There are scattered areas of
fibroglandular density.
FINDINGS: In the left breast, possible asymmetries warrant further evaluation.
In the right breast, no findings suspicious for malignancy. Images
were processed with CAD.
IMPRESSION: Further evaluation is suggested for possible asymmetries in the left
breast.

RECOMMENDATION:
Diagnostic mammogram and possibly ultrasound of the left breast.
(Code:AY-[id number])

The patient will be contacted regarding the findings, and additional
imaging will be scheduled.

BI-RADS CATEGORY  0: Incomplete. Need additional imaging evaluation
and/or prior mammograms for comparison.

## 2016-03-16 DIAGNOSIS — D6489 Other specified anemias: Secondary | ICD-10-CM | POA: Diagnosis not present

## 2016-03-16 DIAGNOSIS — I10 Essential (primary) hypertension: Secondary | ICD-10-CM | POA: Diagnosis not present

## 2016-03-23 ENCOUNTER — Other Ambulatory Visit: Payer: Self-pay | Admitting: Internal Medicine

## 2016-03-23 DIAGNOSIS — Z780 Asymptomatic menopausal state: Secondary | ICD-10-CM | POA: Diagnosis not present

## 2016-03-23 DIAGNOSIS — Z1239 Encounter for other screening for malignant neoplasm of breast: Secondary | ICD-10-CM | POA: Diagnosis not present

## 2016-03-23 DIAGNOSIS — E034 Atrophy of thyroid (acquired): Secondary | ICD-10-CM | POA: Diagnosis not present

## 2016-03-23 DIAGNOSIS — K219 Gastro-esophageal reflux disease without esophagitis: Secondary | ICD-10-CM | POA: Diagnosis not present

## 2016-03-23 DIAGNOSIS — M150 Primary generalized (osteo)arthritis: Secondary | ICD-10-CM | POA: Diagnosis not present

## 2016-03-23 DIAGNOSIS — Z1231 Encounter for screening mammogram for malignant neoplasm of breast: Secondary | ICD-10-CM

## 2016-03-23 DIAGNOSIS — E784 Other hyperlipidemia: Secondary | ICD-10-CM | POA: Diagnosis not present

## 2016-03-23 DIAGNOSIS — M5136 Other intervertebral disc degeneration, lumbar region: Secondary | ICD-10-CM | POA: Diagnosis not present

## 2016-03-23 DIAGNOSIS — I10 Essential (primary) hypertension: Secondary | ICD-10-CM | POA: Diagnosis not present

## 2016-03-23 DIAGNOSIS — D6489 Other specified anemias: Secondary | ICD-10-CM | POA: Diagnosis not present

## 2016-03-30 DIAGNOSIS — D6489 Other specified anemias: Secondary | ICD-10-CM | POA: Diagnosis not present

## 2016-04-04 DIAGNOSIS — Z780 Asymptomatic menopausal state: Secondary | ICD-10-CM | POA: Diagnosis not present

## 2016-04-06 DIAGNOSIS — D649 Anemia, unspecified: Secondary | ICD-10-CM | POA: Diagnosis not present

## 2016-04-06 DIAGNOSIS — M85869 Other specified disorders of bone density and structure, unspecified lower leg: Secondary | ICD-10-CM | POA: Insufficient documentation

## 2016-04-06 DIAGNOSIS — D6489 Other specified anemias: Secondary | ICD-10-CM | POA: Diagnosis not present

## 2016-04-08 ENCOUNTER — Other Ambulatory Visit: Payer: Self-pay | Admitting: Internal Medicine

## 2016-04-08 ENCOUNTER — Ambulatory Visit
Admission: RE | Admit: 2016-04-08 | Discharge: 2016-04-08 | Disposition: A | Discharge status: Home / Self Care | Payer: Commercial Managed Care - HMO | Source: Ambulatory Visit | Attending: Internal Medicine | Admitting: Internal Medicine

## 2016-04-08 DIAGNOSIS — Z1231 Encounter for screening mammogram for malignant neoplasm of breast: Secondary | ICD-10-CM

## 2016-05-11 DIAGNOSIS — D6489 Other specified anemias: Secondary | ICD-10-CM | POA: Diagnosis not present

## 2016-07-20 DIAGNOSIS — Z23 Encounter for immunization: Secondary | ICD-10-CM | POA: Diagnosis not present

## 2016-09-14 DIAGNOSIS — D6489 Other specified anemias: Secondary | ICD-10-CM | POA: Diagnosis not present

## 2016-09-14 DIAGNOSIS — I10 Essential (primary) hypertension: Secondary | ICD-10-CM | POA: Diagnosis not present

## 2016-09-21 DIAGNOSIS — I10 Essential (primary) hypertension: Secondary | ICD-10-CM | POA: Diagnosis not present

## 2016-09-21 DIAGNOSIS — M5136 Other intervertebral disc degeneration, lumbar region: Secondary | ICD-10-CM | POA: Diagnosis not present

## 2016-09-21 DIAGNOSIS — K219 Gastro-esophageal reflux disease without esophagitis: Secondary | ICD-10-CM | POA: Diagnosis not present

## 2016-09-21 DIAGNOSIS — Z0000 Encounter for general adult medical examination without abnormal findings: Secondary | ICD-10-CM | POA: Diagnosis not present

## 2016-09-21 DIAGNOSIS — E784 Other hyperlipidemia: Secondary | ICD-10-CM | POA: Diagnosis not present

## 2016-09-21 DIAGNOSIS — D649 Anemia, unspecified: Secondary | ICD-10-CM | POA: Diagnosis not present

## 2016-09-21 DIAGNOSIS — E034 Atrophy of thyroid (acquired): Secondary | ICD-10-CM | POA: Diagnosis not present

## 2016-09-21 DIAGNOSIS — M150 Primary generalized (osteo)arthritis: Secondary | ICD-10-CM | POA: Diagnosis not present

## 2016-11-16 DIAGNOSIS — M25311 Other instability, right shoulder: Secondary | ICD-10-CM | POA: Diagnosis not present

## 2016-11-16 DIAGNOSIS — M19011 Primary osteoarthritis, right shoulder: Secondary | ICD-10-CM | POA: Diagnosis not present

## 2016-11-29 IMAGING — MG MM DIGITAL SCREENING BILAT W/ TOMO W/ CAD
8 of 13 series · 8 of 29 positions shown · non-contrast
Comparison: Previous exam(s).

CLINICAL DATA: Screening.

EXAM:
2D DIGITAL SCREENING BILATERAL MAMMOGRAM WITH CAD AND ADJUNCT TOMO

[R MLO (1 of 2)]
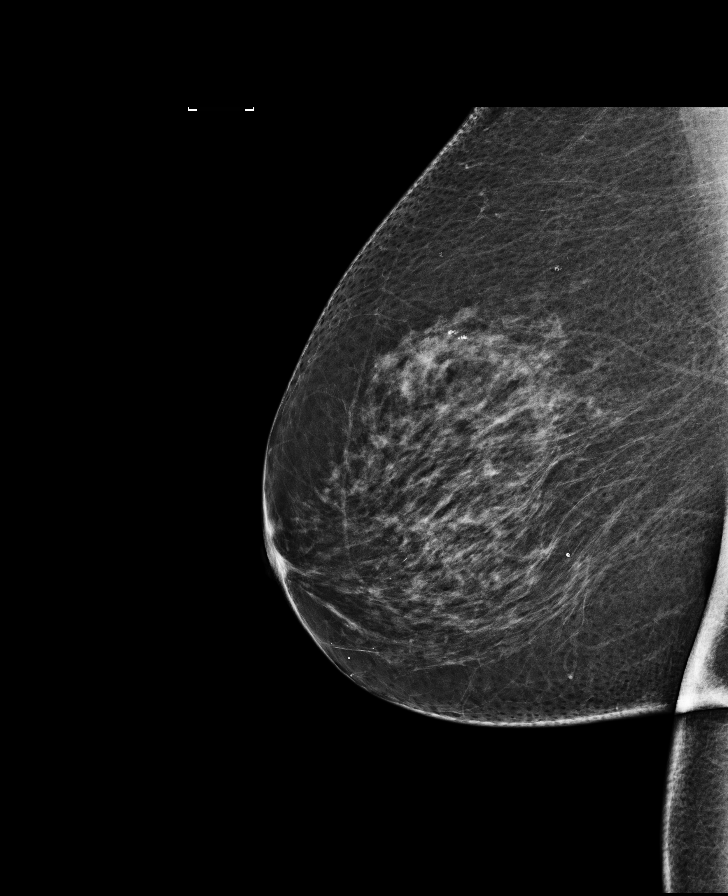

[R MLO (2 of 2)]
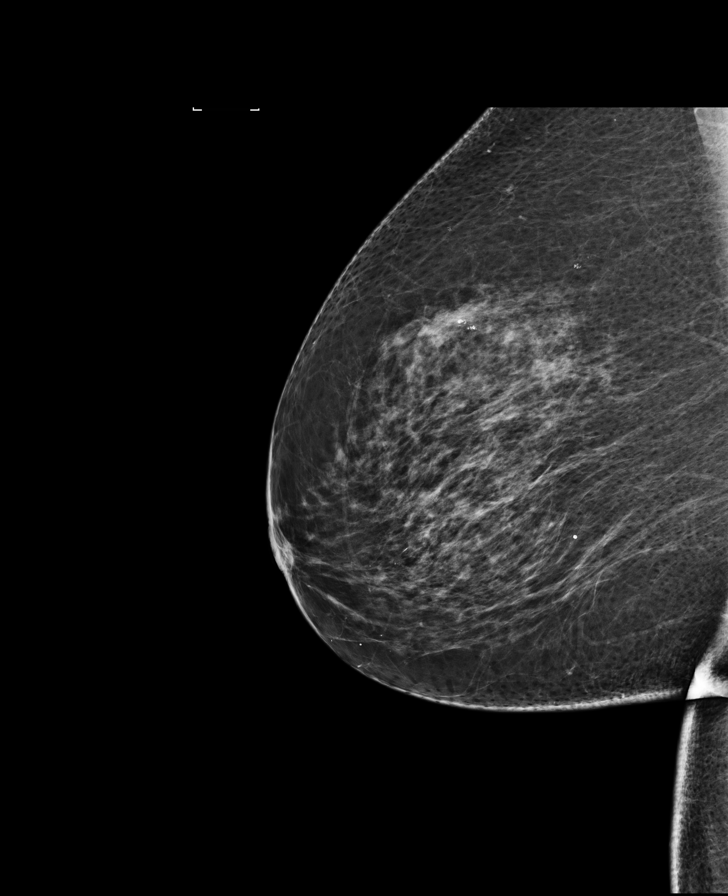

[L CC]
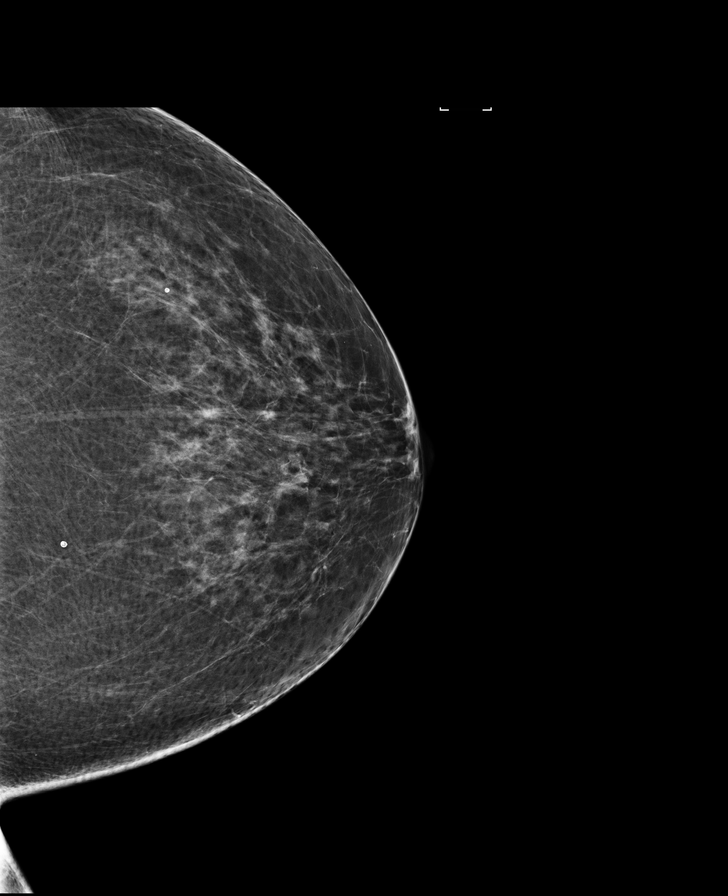

[L MLO]
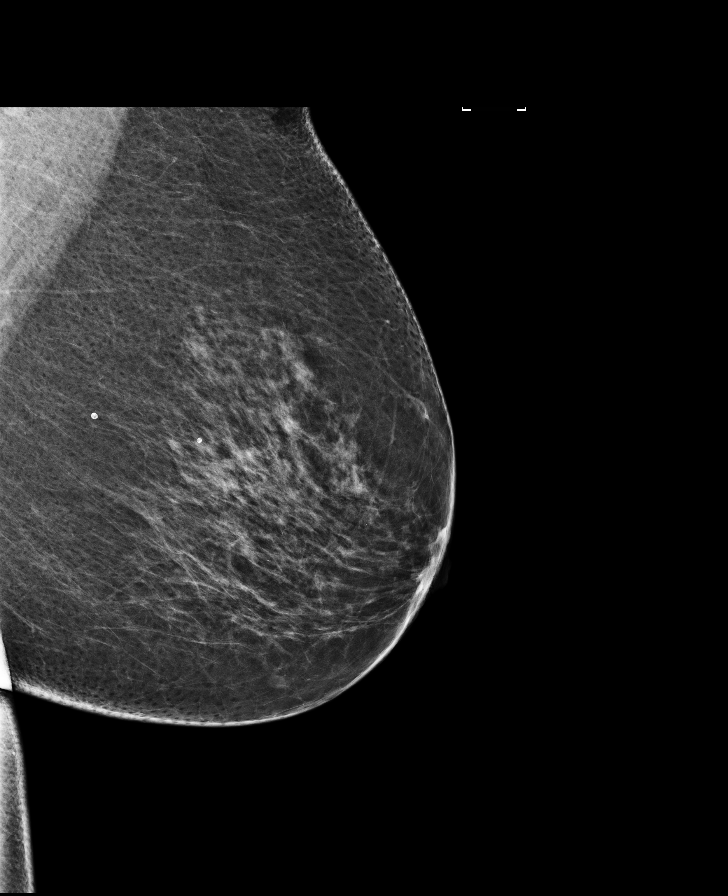

[R CC synth-2D]
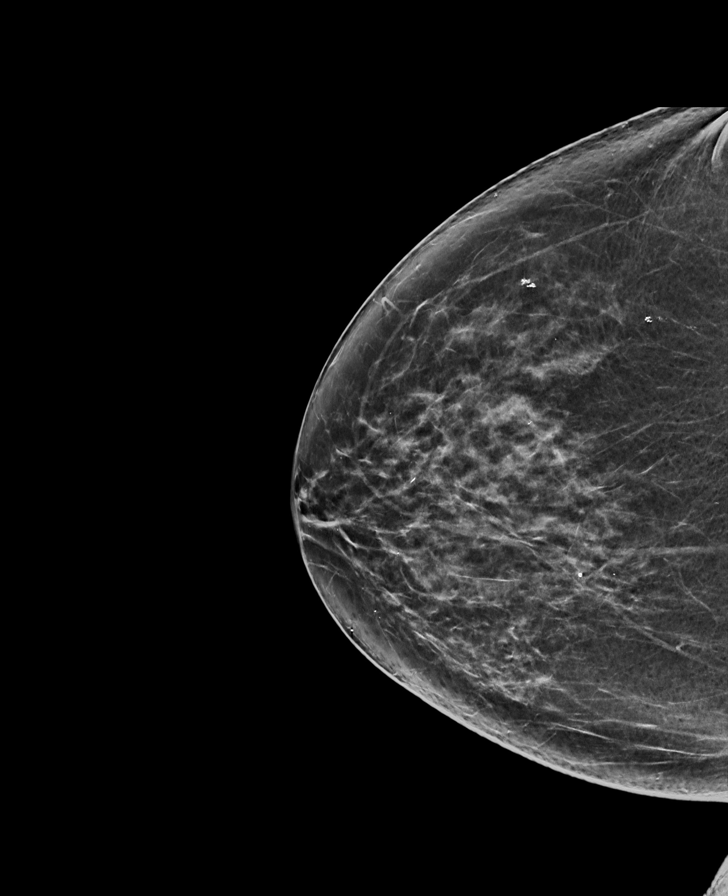

[L CC synth-2D]
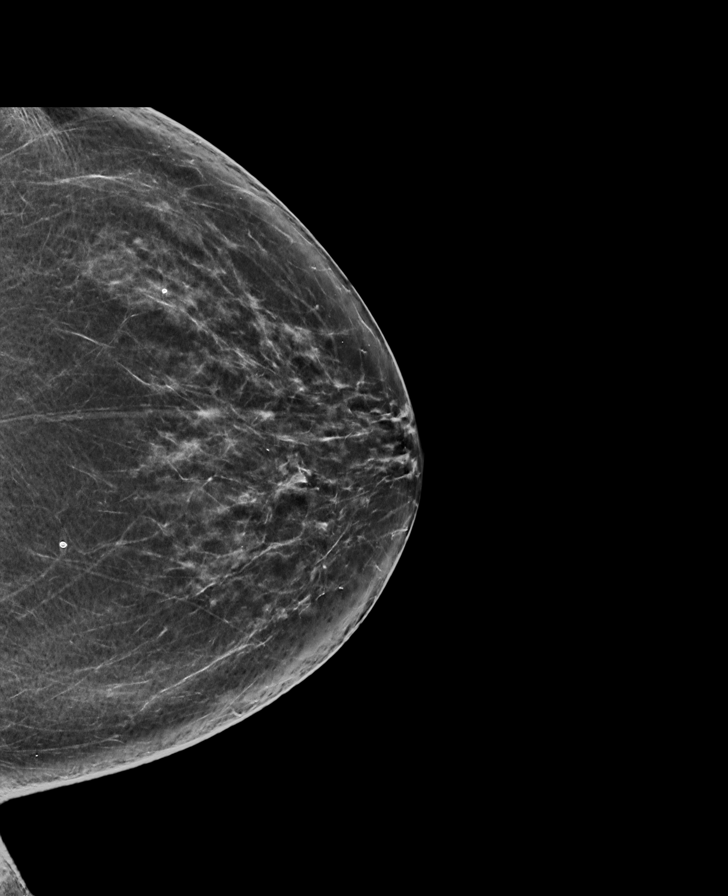

[L MLO synth-2D]
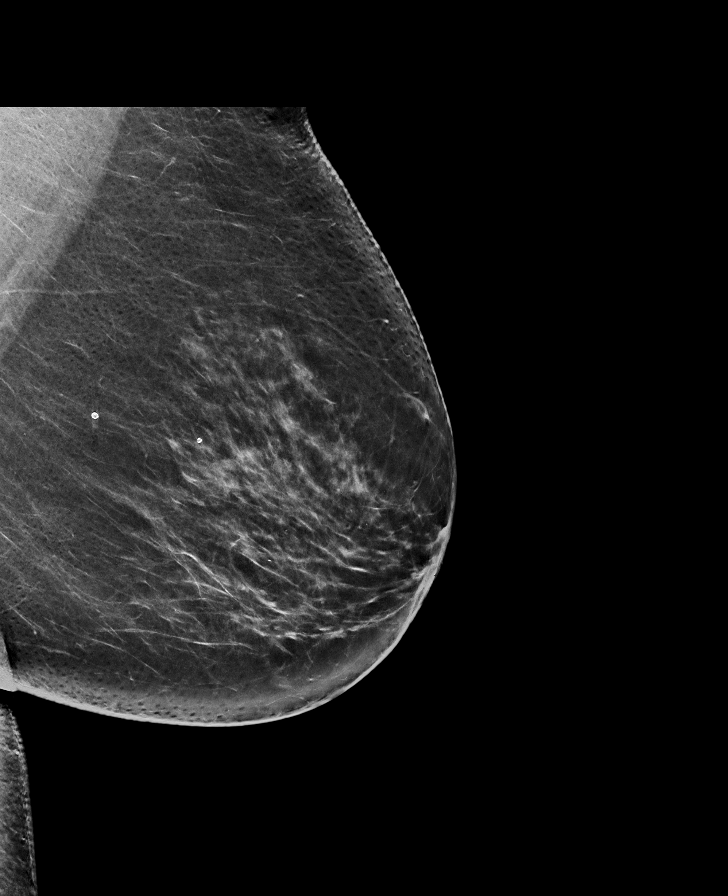

[R MLO synth-2D]
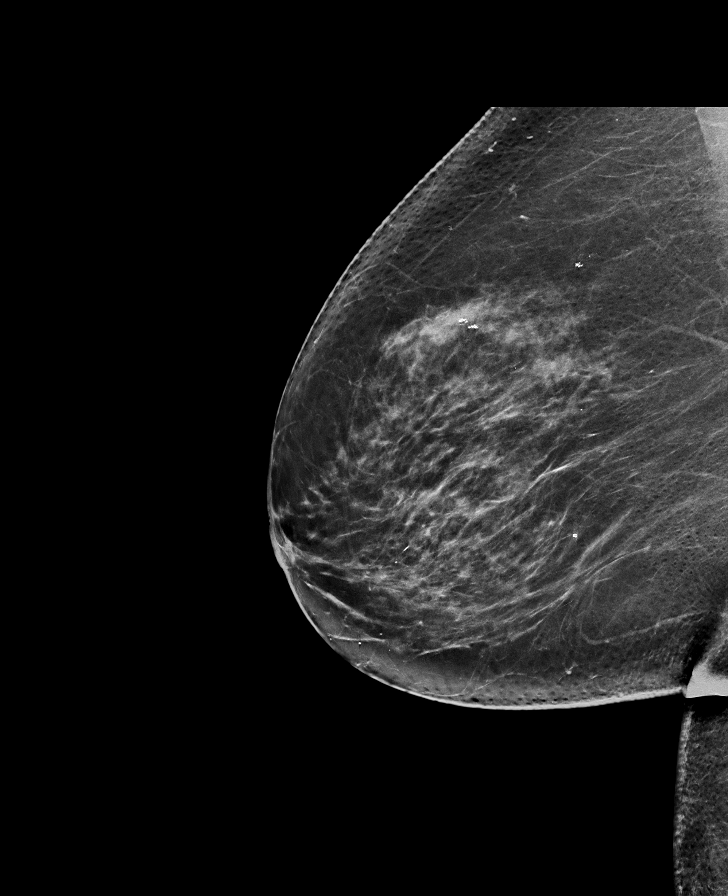

[8 of 29 positions shown; findings below may reference images not displayed]

ACR Breast Density Category b: There are scattered areas of
fibroglandular density.
FINDINGS: There are no findings suspicious for malignancy. Images were
processed with CAD.
IMPRESSION: No mammographic evidence of malignancy. A result letter of this
screening mammogram will be mailed directly to the patient.

RECOMMENDATION:
Screening mammogram in one year. (Code:97-6-RS4)

BI-RADS CATEGORY  1: Negative.

## 2016-12-14 DIAGNOSIS — M19011 Primary osteoarthritis, right shoulder: Secondary | ICD-10-CM | POA: Diagnosis not present

## 2016-12-14 DIAGNOSIS — M25311 Other instability, right shoulder: Secondary | ICD-10-CM | POA: Diagnosis not present

## 2016-12-22 DIAGNOSIS — D649 Anemia, unspecified: Secondary | ICD-10-CM | POA: Diagnosis not present

## 2016-12-22 DIAGNOSIS — E784 Other hyperlipidemia: Secondary | ICD-10-CM | POA: Diagnosis not present

## 2016-12-22 DIAGNOSIS — I10 Essential (primary) hypertension: Secondary | ICD-10-CM | POA: Diagnosis not present

## 2016-12-22 DIAGNOSIS — G8929 Other chronic pain: Secondary | ICD-10-CM | POA: Diagnosis not present

## 2016-12-22 DIAGNOSIS — M25511 Pain in right shoulder: Secondary | ICD-10-CM | POA: Diagnosis not present

## 2016-12-23 DIAGNOSIS — M1711 Unilateral primary osteoarthritis, right knee: Secondary | ICD-10-CM | POA: Diagnosis not present

## 2017-01-04 NOTE — H&P (Signed)
  Susan Khan is an 76 y.o. female.    Chief Complaint: right shoulder pain and weakness  HPI: Pt is a 76 y.o. female complaining of right shoulder pain for multiple years. Pain had continually increased since the beginning. X-rays in the clinic show end-stage arthritic changes of the right shoulder. Pt has tried various conservative treatments which have failed to alleviate their symptoms, including injections and therapy. Various options are discussed with the patient. Risks, benefits and expectations were discussed with the patient. Patient understand the risks, benefits and expectations and wishes to proceed with surgery.   PCP:  Lynnea FerrierBERT J KLEIN III, MD  D/C Plans: Home  PMH: No past medical history on file.  PSH: No past surgical history on file.  Social History:  has no tobacco, alcohol, and drug history on file.  Allergies:  Allergies not on file  Medications: No current facility-administered medications for this encounter.    No current outpatient prescriptions on file.    No results found for this or any previous visit (from the past 48 hour(s)). No results found.  ROS: Pain with rom of the right upper extremity  Physical Exam:  Alert and oriented 76 y.o. female in no acute distress Cranial nerves 2-12 intact Cervical spine: full rom with no tenderness, nv intact distally Chest: active breath sounds bilaterally, no wheeze rhonchi or rales Heart: regular rate and rhythm, no murmur Abd: non tender non distended with active bowel sounds Hip is stable with rom  Right shoulder with limited rom and weakness nv intact distally No rashes or edema  Assessment/Plan Assessment: right shoulder cuff arthropathy  Plan: Patient will undergo a right reverse total shoulder by [name] [facility]. Risks benefits and expectations were discussed with the patient. Patient understand risks, benefits and expectations and wishes to proceed.

## 2017-01-12 NOTE — Pre-Procedure Instructions (Signed)
    Nadara ModeGloria Carol Khan  01/12/2017      [facility] [id number] [place]- [place], [place] - [address] AT [facility] [address] [place] [place] [address] Phone: [phone] Fax: [phone]    Your procedure is scheduled on Fri. Mar. 30 at 730  [facility] at 530 AM.  Call this number if you have problems the morning of surgery: [phone]   Remember:  Do not eat food or drink liquids after midnight.  Take these medicines the morning of surgery with A SIP OF WATER acetaminophen (tylenol), artificial tears, levothyroxine (synthroid), opcon-A eye drops, omeprazole (prilosec).  Stop taking any Vitamins or Herbal Medications. No Aspirin Goody's, BC's, Aleve, Advil, Motrin, Ibuprofen or Fish oil.   Do not wear jewelry, make-up or nail polish.  Do not wear lotions, powders, or perfumes, or deoderant.  Do not shave 48 hours prior to surgery.  Men may shave face and neck.  Do not bring valuables to the hospital.  [facility] is not responsible for any belongings or valuables.  Contacts, dentures or bridgework may not be worn into surgery.  Leave your suitcase in the car.  After surgery it may be brought to your room.  For patients admitted to the hospital, discharge time will be determined by your treatment team.  Patients discharged the day of surgery will not be allowed to drive home.    Special instructions:  See attached  Please read over the following fact sheets that you were given. Pain Booklet, Coughing and Deep Breathing, MRSA Information and Surgical Site Infection Prevention

## 2017-01-13 ENCOUNTER — Encounter (HOSPITAL_COMMUNITY): Payer: Self-pay

## 2017-01-13 ENCOUNTER — Encounter (HOSPITAL_COMMUNITY)
Admission: RE | Admit: 2017-01-13 | Discharge: 2017-01-13 | Disposition: A | Discharge status: Home / Self Care | Payer: Medicare HMO | Source: Ambulatory Visit | Attending: Orthopedic Surgery | Admitting: Orthopedic Surgery

## 2017-01-13 DIAGNOSIS — Z01812 Encounter for preprocedural laboratory examination: Secondary | ICD-10-CM | POA: Insufficient documentation

## 2017-01-13 DIAGNOSIS — M25511 Pain in right shoulder: Secondary | ICD-10-CM | POA: Insufficient documentation

## 2017-01-13 DIAGNOSIS — M19011 Primary osteoarthritis, right shoulder: Secondary | ICD-10-CM | POA: Insufficient documentation

## 2017-01-13 HISTORY — PX: JOINT REPLACEMENT: SHX530

## 2017-01-13 HISTORY — DX: Gastro-esophageal reflux disease without esophagitis: K21.9

## 2017-01-13 HISTORY — DX: Personal history of other diseases of the digestive system: Z87.19

## 2017-01-13 HISTORY — DX: Essential (primary) hypertension: I10

## 2017-01-13 HISTORY — DX: Unspecified osteoarthritis, unspecified site: M19.90

## 2017-01-13 HISTORY — DX: Personal history of urinary calculi: Z87.442

## 2017-01-13 HISTORY — DX: Hypothyroidism, unspecified: E03.9

## 2017-01-13 LAB — BASIC METABOLIC PANEL
ANION GAP: 9 (ref 5–15)
BUN: 9 mg/dL (ref 6–20)
CO2: 27 mmol/L (ref 22–32)
Calcium: 9.7 mg/dL (ref 8.9–10.3)
Chloride: 101 mmol/L (ref 101–111)
Creatinine, Ser: 0.81 mg/dL (ref 0.44–1.00)
Glucose, Bld: 87 mg/dL (ref 65–99)
Potassium: 4.3 mmol/L (ref 3.5–5.1)
Sodium: 137 mmol/L (ref 135–145)

## 2017-01-13 LAB — CBC
HCT: 40.8 % (ref 36.0–46.0)
HEMOGLOBIN: 13.6 g/dL (ref 12.0–15.0)
MCH: 32 pg (ref 26.0–34.0)
MCHC: 33.3 g/dL (ref 30.0–36.0)
MCV: 96 fL (ref 78.0–100.0)
Platelets: 223 10*3/uL (ref 150–400)
RBC: 4.25 MIL/uL (ref 3.87–5.11)
RDW: 13.1 % (ref 11.5–15.5)
WBC: 6.8 10*3/uL (ref 4.0–10.5)

## 2017-01-13 LAB — SURGICAL PCR SCREEN
MRSA, PCR: NEGATIVE
STAPHYLOCOCCUS AUREUS: NEGATIVE

## 2017-01-13 NOTE — Progress Notes (Addendum)
PCP: Dr. Worthy KeelerBurt Khan  Cardiologist:  [facility]  Stress test: pt denies ever  ECHO: pt denies ever  Cardiac cath:pt denies ever   Chest x-ray: pt denies past year

## 2017-01-20 ENCOUNTER — Inpatient Hospital Stay (HOSPITAL_COMMUNITY)
Admission: RE | Admit: 2017-01-20 | Discharge: 2017-01-21 | DRG: 483 | Disposition: A | Discharge status: Home / Self Care | Payer: Medicare HMO | Source: Ambulatory Visit | Attending: Orthopedic Surgery | Admitting: Orthopedic Surgery

## 2017-01-20 ENCOUNTER — Inpatient Hospital Stay (HOSPITAL_COMMUNITY): Payer: Medicare HMO | Admitting: Emergency Medicine

## 2017-01-20 ENCOUNTER — Inpatient Hospital Stay (HOSPITAL_COMMUNITY): Payer: Medicare HMO

## 2017-01-20 ENCOUNTER — Inpatient Hospital Stay (HOSPITAL_COMMUNITY): Payer: Medicare HMO | Admitting: Anesthesiology

## 2017-01-20 ENCOUNTER — Encounter (HOSPITAL_COMMUNITY)
Admission: RE | Disposition: A | Discharge status: Home / Self Care | Payer: Self-pay | Source: Ambulatory Visit | Attending: Orthopedic Surgery

## 2017-01-20 DIAGNOSIS — G8918 Other acute postprocedural pain: Secondary | ICD-10-CM | POA: Diagnosis not present

## 2017-01-20 DIAGNOSIS — M19011 Primary osteoarthritis, right shoulder: Secondary | ICD-10-CM | POA: Diagnosis present

## 2017-01-20 DIAGNOSIS — M75101 Unspecified rotator cuff tear or rupture of right shoulder, not specified as traumatic: Secondary | ICD-10-CM | POA: Diagnosis not present

## 2017-01-20 DIAGNOSIS — Z471 Aftercare following joint replacement surgery: Secondary | ICD-10-CM | POA: Diagnosis not present

## 2017-01-20 DIAGNOSIS — M25711 Osteophyte, right shoulder: Secondary | ICD-10-CM | POA: Diagnosis not present

## 2017-01-20 DIAGNOSIS — Z96651 Presence of right artificial knee joint: Secondary | ICD-10-CM | POA: Diagnosis not present

## 2017-01-20 DIAGNOSIS — M25511 Pain in right shoulder: Secondary | ICD-10-CM | POA: Diagnosis present

## 2017-01-20 DIAGNOSIS — I10 Essential (primary) hypertension: Secondary | ICD-10-CM | POA: Diagnosis not present

## 2017-01-20 DIAGNOSIS — Z96611 Presence of right artificial shoulder joint: Secondary | ICD-10-CM

## 2017-01-20 HISTORY — PX: REVERSE SHOULDER ARTHROPLASTY: SHX5054

## 2017-01-20 SURGERY — ARTHROPLASTY, SHOULDER, TOTAL, REVERSE
Anesthesia: Regional | Site: Shoulder | Laterality: Right

## 2017-01-20 MED ORDER — PANTOPRAZOLE SODIUM 40 MG PO TBEC
80.0000 mg | DELAYED_RELEASE_TABLET | Freq: Every day | ORAL | Status: DC
  Administered 2017-01-21: 80 mg via ORAL
  Filled 2017-01-20: qty 2

## 2017-01-20 MED ORDER — SODIUM CHLORIDE 0.9 % IV SOLN: INTRAVENOUS | Status: DC

## 2017-01-20 MED ORDER — CEFAZOLIN SODIUM-DEXTROSE 2-4 GM/100ML-% IV SOLN
2.0000 g | Freq: Four times a day (QID) | INTRAVENOUS | Status: AC
  Administered 2017-01-20 – 2017-01-21 (×3): 2 g via INTRAVENOUS
  Filled 2017-01-20 (×3): qty 100

## 2017-01-20 MED ORDER — HYDROCODONE-ACETAMINOPHEN 5-325 MG PO TABS: 1.0000 | ORAL_TABLET | ORAL | 0 refills | Status: AC | PRN

## 2017-01-20 MED ORDER — PHENOL 1.4 % MT LIQD: 1.0000 | OROMUCOSAL | Status: DC | PRN

## 2017-01-20 MED ORDER — MENTHOL 3 MG MT LOZG: 1.0000 | LOZENGE | OROMUCOSAL | Status: DC | PRN

## 2017-01-20 MED ORDER — GLYCOPYRROLATE 0.2 MG/ML IJ SOLN
INTRAMUSCULAR | Status: DC | PRN
  Administered 2017-01-20: 0.3 mg via INTRAVENOUS

## 2017-01-20 MED ORDER — LEVOTHYROXINE SODIUM 75 MCG PO TABS
75.0000 ug | ORAL_TABLET | Freq: Every day | ORAL | Status: DC
  Administered 2017-01-21: 75 ug via ORAL
  Filled 2017-01-20: qty 1

## 2017-01-20 MED ORDER — ACETAMINOPHEN 500 MG PO TABS
500.0000 mg | ORAL_TABLET | Freq: Two times a day (BID) | ORAL | Status: DC
  Administered 2017-01-20 – 2017-01-21 (×2): 500 mg via ORAL
  Filled 2017-01-20 (×2): qty 1

## 2017-01-20 MED ORDER — HYDROCODONE-ACETAMINOPHEN 5-325 MG PO TABS
1.0000 | ORAL_TABLET | ORAL | Status: DC | PRN
  Administered 2017-01-20 – 2017-01-21 (×2): 1 via ORAL
  Filled 2017-01-20 (×2): qty 1

## 2017-01-20 MED ORDER — ADULT MULTIVITAMIN W/MINERALS CH
1.0000 | ORAL_TABLET | Freq: Every day | ORAL | Status: DC
  Administered 2017-01-21: 1 via ORAL
  Filled 2017-01-20: qty 1

## 2017-01-20 MED ORDER — VITAMIN D 1000 UNITS PO TABS
1000.0000 [IU] | ORAL_TABLET | Freq: Every day | ORAL | Status: DC
  Administered 2017-01-21: 1000 [IU] via ORAL
  Filled 2017-01-20: qty 1

## 2017-01-20 MED ORDER — EPINEPHRINE PF 1 MG/ML IJ SOLN
INTRAMUSCULAR | Status: AC
Start: 2017-01-20 — End: 2017-01-20
  Filled 2017-01-20: qty 1

## 2017-01-20 MED ORDER — STERILE WATER FOR IRRIGATION IR SOLN
Status: DC | PRN
  Administered 2017-01-20: 1000 mL

## 2017-01-20 MED ORDER — POLYETHYLENE GLYCOL 3350 17 G PO PACK: 17.0000 g | PACK | Freq: Every day | ORAL | Status: DC | PRN

## 2017-01-20 MED ORDER — ONDANSETRON HCL 4 MG/2ML IJ SOLN
INTRAMUSCULAR | Status: DC | PRN
  Administered 2017-01-20: 4 mg via INTRAVENOUS

## 2017-01-20 MED ORDER — ACETAMINOPHEN 650 MG RE SUPP: 650.0000 mg | Freq: Four times a day (QID) | RECTAL | Status: DC | PRN

## 2017-01-20 MED ORDER — PRAVASTATIN SODIUM 40 MG PO TABS
40.0000 mg | ORAL_TABLET | Freq: Every day | ORAL | Status: DC
  Administered 2017-01-20: 40 mg via ORAL
  Filled 2017-01-20 (×2): qty 1

## 2017-01-20 MED ORDER — FLUOROMETHOLONE 0.1 % OP SUSP
1.0000 [drp] | Freq: Every day | OPHTHALMIC | Status: DC | PRN
  Filled 2017-01-20: qty 5

## 2017-01-20 MED ORDER — NAPHAZOLINE-PHENIRAMINE 0.027-0.315 % OP SOLN
1.0000 [drp] | Freq: Every day | OPHTHALMIC | Status: DC | PRN
  Filled 2017-01-20: qty 15

## 2017-01-20 MED ORDER — MIDAZOLAM HCL 2 MG/2ML IJ SOLN
INTRAMUSCULAR | Status: AC
Start: 2017-01-20 — End: 2017-01-20
  Filled 2017-01-20: qty 2

## 2017-01-20 MED ORDER — ONDANSETRON HCL 4 MG/2ML IJ SOLN: 4.0000 mg | Freq: Four times a day (QID) | INTRAMUSCULAR | Status: DC | PRN

## 2017-01-20 MED ORDER — DEXAMETHASONE SODIUM PHOSPHATE 10 MG/ML IJ SOLN
INTRAMUSCULAR | Status: DC | PRN
  Administered 2017-01-20: 10 mg via INTRAVENOUS

## 2017-01-20 MED ORDER — PHENYLEPHRINE HCL 10 MG/ML IJ SOLN
INTRAVENOUS | Status: DC | PRN
  Administered 2017-01-20: 50 ug/min via INTRAVENOUS

## 2017-01-20 MED ORDER — METOCLOPRAMIDE HCL 5 MG PO TABS: 5.0000 mg | ORAL_TABLET | Freq: Three times a day (TID) | ORAL | Status: DC | PRN

## 2017-01-20 MED ORDER — CEFAZOLIN SODIUM-DEXTROSE 2-4 GM/100ML-% IV SOLN
INTRAVENOUS | Status: AC
  Filled 2017-01-20: qty 100

## 2017-01-20 MED ORDER — LACTATED RINGERS IV SOLN
INTRAVENOUS | Status: DC | PRN
  Administered 2017-01-20 (×2): via INTRAVENOUS

## 2017-01-20 MED ORDER — DOCUSATE SODIUM 100 MG PO CAPS
100.0000 mg | ORAL_CAPSULE | Freq: Two times a day (BID) | ORAL | Status: DC
  Administered 2017-01-20 – 2017-01-21 (×2): 100 mg via ORAL
  Filled 2017-01-20 (×2): qty 1

## 2017-01-20 MED ORDER — FENTANYL CITRATE (PF) 250 MCG/5ML IJ SOLN
INTRAMUSCULAR | Status: AC
  Filled 2017-01-20: qty 5

## 2017-01-20 MED ORDER — METOCLOPRAMIDE HCL 5 MG/ML IJ SOLN: 5.0000 mg | Freq: Three times a day (TID) | INTRAMUSCULAR | Status: DC | PRN

## 2017-01-20 MED ORDER — ROPIVACAINE HCL 5 MG/ML IJ SOLN
INTRAMUSCULAR | Status: DC | PRN
  Administered 2017-01-20: 30 mL via PERINEURAL

## 2017-01-20 MED ORDER — ACETAMINOPHEN 325 MG PO TABS: 650.0000 mg | ORAL_TABLET | Freq: Four times a day (QID) | ORAL | Status: DC | PRN

## 2017-01-20 MED ORDER — BUPIVACAINE HCL (PF) 0.25 % IJ SOLN
INTRAMUSCULAR | Status: AC
  Filled 2017-01-20: qty 30

## 2017-01-20 MED ORDER — PROPOFOL 10 MG/ML IV BOLUS
INTRAVENOUS | Status: DC | PRN
  Administered 2017-01-20: 130 mg via INTRAVENOUS

## 2017-01-20 MED ORDER — ONDANSETRON HCL 4 MG PO TABS: 4.0000 mg | ORAL_TABLET | Freq: Four times a day (QID) | ORAL | Status: DC | PRN

## 2017-01-20 MED ORDER — FENTANYL CITRATE (PF) 250 MCG/5ML IJ SOLN
INTRAMUSCULAR | Status: DC | PRN
  Administered 2017-01-20: 100 ug via INTRAVENOUS

## 2017-01-20 MED ORDER — PROPOFOL 10 MG/ML IV BOLUS
INTRAVENOUS | Status: AC
  Filled 2017-01-20: qty 20

## 2017-01-20 MED ORDER — LIDOCAINE HCL (CARDIAC) 20 MG/ML IV SOLN
INTRAVENOUS | Status: DC | PRN
  Administered 2017-01-20: 50 mg via INTRATRACHEAL

## 2017-01-20 MED ORDER — CEFAZOLIN SODIUM-DEXTROSE 2-4 GM/100ML-% IV SOLN
2.0000 g | INTRAVENOUS | Status: AC
  Administered 2017-01-20: 2 g via INTRAVENOUS

## 2017-01-20 MED ORDER — 0.9 % SODIUM CHLORIDE (POUR BTL) OPTIME
TOPICAL | Status: DC | PRN
  Administered 2017-01-20: 1000 mL

## 2017-01-20 MED ORDER — BUPIVACAINE-EPINEPHRINE 0.25% -1:200000 IJ SOLN
INTRAMUSCULAR | Status: DC | PRN
  Administered 2017-01-20: 10 mL

## 2017-01-20 MED ORDER — SUGAMMADEX SODIUM 200 MG/2ML IV SOLN
INTRAVENOUS | Status: DC | PRN
Start: 2017-01-20 — End: 2017-01-20
  Administered 2017-01-20: 200 mg via INTRAVENOUS

## 2017-01-20 MED ORDER — ROCURONIUM BROMIDE 100 MG/10ML IV SOLN
INTRAVENOUS | Status: DC | PRN
  Administered 2017-01-20: 30 mg via INTRAVENOUS

## 2017-01-20 MED ORDER — CHLORHEXIDINE GLUCONATE 4 % EX LIQD: 60.0000 mL | Freq: Once | CUTANEOUS | Status: DC

## 2017-01-20 MED ORDER — MORPHINE SULFATE (PF) 2 MG/ML IV SOLN
2.0000 mg | INTRAVENOUS | Status: DC | PRN
  Filled 2017-01-20: qty 1

## 2017-01-20 MED ORDER — MIDAZOLAM HCL 5 MG/5ML IJ SOLN
INTRAMUSCULAR | Status: DC | PRN
  Administered 2017-01-20: 2 mg via INTRAVENOUS

## 2017-01-20 MED ORDER — LOSARTAN POTASSIUM 25 MG PO TABS
25.0000 mg | ORAL_TABLET | Freq: Every day | ORAL | Status: DC
  Administered 2017-01-20: 25 mg via ORAL
  Filled 2017-01-20: qty 1

## 2017-01-20 SURGICAL SUPPLY — 74 items
BIT DRILL 170X2.5X (BIT) ×1 IMPLANT
BIT DRILL 5/64X5 DISP (BIT) IMPLANT
BIT DRL 170X2.5X (BIT) ×1
BLADE SAG 18X100X1.27 (BLADE) ×3 IMPLANT
CAPT SHLDR REVTOTAL 1 ×3 IMPLANT
CLOSURE STERI-STRIP 1/2X4 (GAUZE/BANDAGES/DRESSINGS) ×1
CLOSURE WOUND 1/2 X4 (GAUZE/BANDAGES/DRESSINGS) ×1
CLSR STERI-STRIP ANTIMIC 1/2X4 (GAUZE/BANDAGES/DRESSINGS) ×2 IMPLANT
COVER SURGICAL LIGHT HANDLE (MISCELLANEOUS) ×3 IMPLANT
DRAPE IMP U-DRAPE 54X76 (DRAPES) ×6 IMPLANT
DRAPE INCISE IOBAN 66X45 STRL (DRAPES) ×3 IMPLANT
DRAPE ORTHO SPLIT 77X108 STRL (DRAPES) ×4
DRAPE SURG ORHT 6 SPLT 77X108 (DRAPES) ×2 IMPLANT
DRAPE U-SHAPE 47X51 STRL (DRAPES) ×3 IMPLANT
DRAPE X-RAY CASS 24X20 (DRAPES) IMPLANT
DRILL 2.5 (BIT) ×2
DRSG ADAPTIC 3X8 NADH LF (GAUZE/BANDAGES/DRESSINGS) ×3 IMPLANT
DRSG PAD ABDOMINAL 8X10 ST (GAUZE/BANDAGES/DRESSINGS) ×3 IMPLANT
DURAPREP 26ML APPLICATOR (WOUND CARE) ×3 IMPLANT
ELECT BLADE 4.0 EZ CLEAN MEGAD (MISCELLANEOUS) ×3
ELECT NEEDLE TIP 2.8 STRL (NEEDLE) ×3 IMPLANT
ELECT REM PT RETURN 9FT ADLT (ELECTROSURGICAL) ×3
ELECTRODE BLDE 4.0 EZ CLN MEGD (MISCELLANEOUS) ×1 IMPLANT
ELECTRODE REM PT RTRN 9FT ADLT (ELECTROSURGICAL) ×1 IMPLANT
FILTER STRAW FLUID ASPIR (MISCELLANEOUS) ×3 IMPLANT
GAUZE SPONGE 4X4 12PLY STRL (GAUZE/BANDAGES/DRESSINGS) ×3 IMPLANT
GAUZE SPONGE 4X4 16PLY XRAY LF (GAUZE/BANDAGES/DRESSINGS) ×3 IMPLANT
GLOVE BIOGEL PI ORTHO PRO 7.5 (GLOVE) ×2
GLOVE BIOGEL PI ORTHO PRO SZ8 (GLOVE) ×2
GLOVE ORTHO TXT STRL SZ7.5 (GLOVE) ×3 IMPLANT
GLOVE PI ORTHO PRO STRL 7.5 (GLOVE) ×1 IMPLANT
GLOVE PI ORTHO PRO STRL SZ8 (GLOVE) ×1 IMPLANT
GLOVE SURG ORTHO 8.5 STRL (GLOVE) ×3 IMPLANT
GOWN STRL REUS W/ TWL LRG LVL3 (GOWN DISPOSABLE) ×1 IMPLANT
GOWN STRL REUS W/ TWL XL LVL3 (GOWN DISPOSABLE) ×2 IMPLANT
GOWN STRL REUS W/TWL LRG LVL3 (GOWN DISPOSABLE) ×2
GOWN STRL REUS W/TWL XL LVL3 (GOWN DISPOSABLE) ×4
HANDPIECE INTERPULSE COAX TIP (DISPOSABLE)
KIT BASIN OR (CUSTOM PROCEDURE TRAY) ×3 IMPLANT
KIT ROOM TURNOVER OR (KITS) ×3 IMPLANT
MANIFOLD NEPTUNE II (INSTRUMENTS) ×3 IMPLANT
NEEDLE 1/2 CIR MAYO (NEEDLE) ×3 IMPLANT
NEEDLE HYPO 25GX1X1/2 BEV (NEEDLE) ×3 IMPLANT
NS IRRIG 1000ML POUR BTL (IV SOLUTION) ×3 IMPLANT
PACK SHOULDER (CUSTOM PROCEDURE TRAY) ×3 IMPLANT
PAD ARMBOARD 7.5X6 YLW CONV (MISCELLANEOUS) ×6 IMPLANT
PIN GUIDE 1.2 (PIN) ×3 IMPLANT
PIN GUIDE GLENOPHERE 1.5MX300M (PIN) IMPLANT
PIN METAGLENE 2.5 (PIN) IMPLANT
SET HNDPC FAN SPRY TIP SCT (DISPOSABLE) IMPLANT
SLING ARM IMMOBILIZER LRG (SOFTGOODS) ×3 IMPLANT
SLING ARM LRG ADULT FOAM STRAP (SOFTGOODS) IMPLANT
SLING ARM MED ADULT FOAM STRAP (SOFTGOODS) IMPLANT
SPONGE LAP 18X18 X RAY DECT (DISPOSABLE) IMPLANT
SPONGE LAP 4X18 X RAY DECT (DISPOSABLE) ×3 IMPLANT
STRIP CLOSURE SKIN 1/2X4 (GAUZE/BANDAGES/DRESSINGS) ×2 IMPLANT
SUCTION FRAZIER HANDLE 10FR (MISCELLANEOUS) ×2
SUCTION TUBE FRAZIER 10FR DISP (MISCELLANEOUS) ×1 IMPLANT
SUT FIBERWIRE #2 38 T-5 BLUE (SUTURE) ×18
SUT MNCRL AB 4-0 PS2 18 (SUTURE) ×3 IMPLANT
SUT VIC AB 0 CT2 27 (SUTURE) ×3 IMPLANT
SUT VIC AB 2-0 CT1 27 (SUTURE) ×2
SUT VIC AB 2-0 CT1 TAPERPNT 27 (SUTURE) ×1 IMPLANT
SUT VICRYL 0 CT 1 36IN (SUTURE) ×3 IMPLANT
SUTURE FIBERWR #2 38 T-5 BLUE (SUTURE) ×6 IMPLANT
SYR CONTROL 10ML LL (SYRINGE) ×3 IMPLANT
SYR TB 1ML LUER SLIP (SYRINGE) ×3 IMPLANT
TAPE CLOTH SURG 4X10 WHT LF (GAUZE/BANDAGES/DRESSINGS) ×3 IMPLANT
TOWEL OR 17X24 6PK STRL BLUE (TOWEL DISPOSABLE) ×3 IMPLANT
TOWEL OR 17X26 10 PK STRL BLUE (TOWEL DISPOSABLE) ×3 IMPLANT
TOWER CARTRIDGE SMART MIX (DISPOSABLE) IMPLANT
TRAY FOLEY W/METER SILVER 16FR (SET/KITS/TRAYS/PACK) IMPLANT
WATER STERILE IRR 1000ML POUR (IV SOLUTION) ×3 IMPLANT
YANKAUER SUCT BULB TIP NO VENT (SUCTIONS) ×3 IMPLANT

## 2017-01-20 NOTE — Anesthesia Procedure Notes (Signed)
Anesthesia Regional Block: Interscalene brachial plexus block   Pre-Anesthetic Checklist: ,, timeout performed, Correct Patient, Correct Site, Correct Laterality, Correct Procedure, Correct Position, site marked, Risks and benefits discussed,  Surgical consent,  Pre-op evaluation,  At surgeon's request and post-op pain management  Laterality: Right  Prep: chloraprep       Needles:  Injection technique: Single-shot      Additional Needles:   Procedures: ultrasound guided,,,,,,,,  Narrative:  Start time: 01/20/2017 7:18 AM End time: 01/20/2017 7:21 AM Injection made incrementally with aspirations every 5 mL.  Performed by: Personally  Anesthesiologist: Anitra Lauth RAY

## 2017-01-20 NOTE — Brief Op Note (Signed)
01/20/2017  10:01 AM  PATIENT:  Susan Khan  76 y.o. female  PRE-OPERATIVE DIAGNOSIS:  Right shoulder osteoarthritis, rotator cuff insufficiency  POST-OPERATIVE DIAGNOSIS:  Right shoulder osteoarthritis, rotator cuff insufficiency  PROCEDURE:  Procedure(s): REVERSE RIGHT SHOULDER ARTHROPLASTY (Right) DePuy Delta Xtend  SURGEON:  Surgeon(s) and Role:    * Beverely Low, MD - Primary  PHYSICIAN ASSISTANT:   ASSISTANTS: Thea Gist, PA-C   ANESTHESIA:   regional and general  EBL:  Total I/O In: 1300 [I.V.:1300] Out: 150 [Blood:150]  BLOOD ADMINISTERED:none  DRAINS: none   LOCAL MEDICATIONS USED:  MARCAINE     SPECIMEN:  No Specimen  DISPOSITION OF SPECIMEN:  N/A  COUNTS:  YES  TOURNIQUET:  * No tourniquets in log *  DICTATION: .Other Dictation: Dictation Number [id number]  PLAN OF CARE: Admit to inpatient   PATIENT DISPOSITION:  PACU - hemodynamically stable.   Delay start of Pharmacological VTE agent (>24hrs) due to surgical blood loss or risk of bleeding: no

## 2017-01-20 NOTE — Interval H&P Note (Signed)
History and Physical Interval Note:  01/20/2017 7:06 AM  Susan Khan  has presented today for surgery, with the diagnosis of Right shoulder osteoarthritis, rotator cuff insufficiency  The various methods of treatment have been discussed with the patient and family. After consideration of risks, benefits and other options for treatment, the patient has consented to  Procedure(s): REVERSE RIGHT SHOULDER ARTHROPLASTY (Right) as a surgical intervention .  The patient's history has been reviewed, patient examined, no change in status, stable for surgery.  I have reviewed the patient's chart and labs.  Questions were answered to the patient's satisfaction.     Kamylle Axelson,STEVEN R

## 2017-01-20 NOTE — Transfer of Care (Signed)
Immediate Anesthesia Transfer of Care Note  Patient: Jozlynn Plaia  Procedure(s) Performed: Procedure(s): REVERSE RIGHT SHOULDER ARTHROPLASTY (Right)  Patient Location: PACU  Anesthesia Type:General  Level of Consciousness: awake, alert  and oriented  Airway & Oxygen Therapy: Patient Spontanous Breathing and Patient connected to nasal cannula oxygen  Post-op Assessment: Report given to RN, Post -op Vital signs reviewed and stable and Patient moving all extremities X 4  Post vital signs: Reviewed and stable  Last Vitals:  Vitals:   01/20/17 0600 01/20/17 0947  BP: (!) 164/77   Pulse: 70   Resp: 20   Temp: 37 C (P) 36.4 C    Last Pain:  Vitals:   01/20/17 0600  TempSrc: Oral      Patients Stated Pain Goal: (P) 0 (01/20/17 0947)  Complications: No apparent anesthesia complications

## 2017-01-20 NOTE — Anesthesia Postprocedure Evaluation (Signed)
Anesthesia Post Note  Patient: Tyashia Morrisette  Procedure(s) Performed: Procedure(s) (LRB): REVERSE RIGHT SHOULDER ARTHROPLASTY (Right)  Patient location during evaluation: PACU Anesthesia Type: Regional and General Level of consciousness: awake and alert Pain management: pain level controlled Vital Signs Assessment: post-procedure vital signs reviewed and stable Respiratory status: spontaneous breathing, nonlabored ventilation and respiratory function stable Cardiovascular status: blood pressure returned to baseline and stable Postop Assessment: no signs of nausea or vomiting Anesthetic complications: no       Last Vitals:  Vitals:   01/20/17 1115 01/20/17 1130  BP: 140/69 138/75  Pulse: (!) 54 (!) 59  Resp: 13 12  Temp:      Last Pain:  Vitals:   01/20/17 0600  TempSrc: Oral                 Lowella Curb

## 2017-01-20 NOTE — Op Note (Signed)
NAME:  ANISIA, LEIJA NO.:  [id number]  MEDICAL RECORD NO.:  [id number]  LOCATION:  [facility]                         FACILITY:  [facility]  PHYSICIAN:  Almedia Balls. Ranell Patrick, M.D. 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  PROCEDURE PERFORMED:  Right reverse total shoulder arthroplasty using DePuy Delta Xtend prosthesis with subscapularis repair.  ATTENDING SURGEON:  Almedia Balls. Ranell Patrick, MD.  ASSISTANT:  Thea Gist, [facility], who scrubbed the entire procedure and necessary for satisfactory completion of surgery.  ANESTHESIA:  General anesthesia was used plus interscalene block.  ESTIMATED BLOOD LOSS:  150 mL.  FLUID REPLACEMENT:  1500 mL crystalloid.  INSTRUMENT COUNTS:  Correct.  COMPLICATIONS:  None.  ANTIBIOTICS:  Perioperative antibiotics were given.  INDICATIONS:  The patient is a 76 year old female with worsening right shoulder pain and dysfunction secondary to severe rotator cuff tear arthropathy.  The patient has profound shoulder dysfunction and severe pain.  The patient has bone-on-bone arthritis on x-ray.  The patient has presented with a failure of conservative management for operative treatment to restore function and eliminate pain.  Informed consent obtained.  DESCRIPTION OF PROCEDURE:  After an adequate level of anesthesia achieved, the patient was positioned in a modified beach-chair position. Right shoulder was correctly identified and examined under anesthesia. The patient had forward elevation about 50 degrees, abduction about 45, external rotation 30, internal rotation 0.  The patient had very, very stiff shoulder.  After sterile prep and drape of shoulder  and arm, time- out called.  We entered the shoulder using standard deltopectoral approach starting at the coracoid process extending down to the anterior humerus.  Dissection down through subcutaneous tissues using the Bovie. We identified the cephalic vein, took it laterally with the deltoid, pectoralis was taken medially.  Conjoint tendon identified and retracted medially.  We identified the biceps tendon and did an in situ tenodesis with 0 Vicryl figure-of-eight suture into the pectoralis tendon.  We then went and released the biceps and released the subscapularis subperiosteally off the lesser tuberosity.  We tagged that with #2 FiberWire with a modified Mason-Allen suture technique for repair at the end.  We released the capsule off the humerus, progressively externally rotating and then extended the shoulder and delivering the humeral head out of the wound, we did after release some remnant of the supraspinatus and infraspinatus tendon.  We left teres minor intact.  We removed the extra tendon tissue.  We then entered the proximal humerus with a 6 mm reamer reaming up to a size 10 reamer.  Once we had the 10 fully down, we took the 10 intramedullary guide and introduced that into the humeral canal.  We then placed our resection block and then resected the humeral head at 10 degrees of retroversion with an oscillating saw.  We removed excess osteophytes with a rongeur.  We then prepared the metaphysis with the epi-1 right metaphyseal reamer.  Once we  had that reamed out, we then introduced our trial stem 10 stem with an epi-1 right set on the 0 setting and placed in 10 degrees of retroversion.  Once we had that impacted in place, we used that to protect the bone, retracted the humerus posteriorly, resected the biceps and the remaining labral tissue.  There was no cartilage left on the glenoid face.  We then found our center point on the glenoid.  We had 360-degree exposure with  good protection of the axillary nerve.  We placed our central guide pin, reamed for the metaglene and drilled our central peg hole.  We then impacted the base plate into position and placed a 42 screw inferiorly, 30 at the base of the coracoid, and then 18 posteriorly, had good purchase and good security with the base plate.  We then took a 38 standard glenosphere and impacted that and screwed that in position, so it was secure, protect our axillary nerve to make sure it was free and clear.  We then reduced the shoulder with a 38+ 3 poly.  We then removed the trial component from the humeral side.  We irrigated thoroughly. Drilled holes in the lesser tuberosity placed #2 FiberWire suture for repair of the bone.  We then irrigated the canal.  We took the HA coated press-fit stem which was a 10 body, epi-1 right metaphysis set on the 0 setting, placed in 10 degrees retroversion and using available bone graft from the head, we used impaction grafting technique and impacted the humeral stem into position with good nice secure fit.  We then went ahead and trialed again with a +3.  We felt like we can get the +6 in place, so we went ahead and at this point, selected the 38+ 6 poly and impacted that onto the humeral side.  We reduced the shoulder with nice little pop.  We had nice tight conjoint axillary nerve not under undue tension.  No gapping with external rotation or inferior pole.  We then repaired the subscapularis anatomically back to bone and also to the biceps tendon, therefore a very nice repair.  We could externally rotate to 45 degrees after the repair.  We irrigated thoroughly and then closed the deltopectoral interval with 0 Vicryl suture, followed by 2-0 Vicryl for subcutaneous closure, and 4-0 Monocryl for skin.  Steri-Strips applied, followed by sterile dressing.  The patient tolerated surgery well.     Almedia Balls. Ranell Patrick, M.D.     SRN/MEDQ  D:  01/20/2017  T:   01/20/2017  Job:  [id number]

## 2017-01-20 NOTE — Anesthesia Procedure Notes (Signed)
Procedure Name: Intubation Date/Time: 01/20/2017 7:41 AM Performed by: Mariea Clonts Pre-anesthesia Checklist: Patient identified, Emergency Drugs available, Suction available and Patient being monitored Patient Re-evaluated:Patient Re-evaluated prior to inductionOxygen Delivery Method: Circle System Utilized Preoxygenation: Pre-oxygenation with 100% oxygen Intubation Type: IV induction Ventilation: Mask ventilation without difficulty, Oral airway inserted - appropriate to patient size and Mask ventilation with difficulty Laryngoscope Size: Mac and 3 Grade View: Grade III Tube type: Oral Tube size: 7.0 mm Number of attempts: 2 Airway Equipment and Method: Oral airway and Bougie stylet Placement Confirmation: ETT inserted through vocal cords under direct vision,  positive ETCO2 and breath sounds checked- equal and bilateral Tube secured with: Tape Dental Injury: Teeth and Oropharynx as per pre-operative assessment  Difficulty Due To: Difficulty was unanticipated and Difficult Airway- due to anterior larynx Future Recommendations: Recommend- induction with short-acting agent, and alternative techniques readily available

## 2017-01-20 NOTE — Anesthesia Preprocedure Evaluation (Addendum)
Anesthesia Evaluation  Patient identified by MRN, date of birth, ID band Patient awake    Reviewed: Allergy & Precautions, NPO status , Patient's Chart, lab work & pertinent test results  Airway Mallampati: II  TM Distance: >3 FB Neck ROM: Full    Dental no notable dental hx.    Pulmonary neg pulmonary ROS,    Pulmonary exam normal breath sounds clear to auscultation       Cardiovascular hypertension, negative cardio ROS Normal cardiovascular exam Rhythm:Regular Rate:Normal     Neuro/Psych negative neurological ROS  negative psych ROS   GI/Hepatic negative GI ROS, Neg liver ROS, hiatal hernia, GERD  ,  Endo/Other  negative endocrine ROSHypothyroidism   Renal/GU negative Renal ROS  negative genitourinary   Musculoskeletal negative musculoskeletal ROS (+) Arthritis ,   Abdominal   Peds negative pediatric ROS (+)  Hematology negative hematology ROS (+)   Anesthesia Other Findings   Reproductive/Obstetrics negative OB ROS                             Anesthesia Physical Anesthesia Plan  ASA: II  Anesthesia Plan: General and Regional   Post-op Pain Management: GA combined w/ Regional for post-op pain   Induction: Intravenous  Airway Management Planned: Oral ETT  Additional Equipment:   Intra-op Plan:   Post-operative Plan: Extubation in OR  Informed Consent: I have reviewed the patients History and Physical, chart, labs and discussed the procedure including the risks, benefits and alternatives for the proposed anesthesia with the patient or authorized representative who has indicated his/her understanding and acceptance.   Dental advisory given  Plan Discussed with: CRNA  Anesthesia Plan Comments:        Anesthesia Quick Evaluation

## 2017-01-21 LAB — HEMOGLOBIN AND HEMATOCRIT, BLOOD
HEMATOCRIT: 32.7 % — AB (ref 36.0–46.0)
Hemoglobin: 10.9 g/dL — ABNORMAL LOW (ref 12.0–15.0)

## 2017-01-21 NOTE — Progress Notes (Signed)
PT Cancellation Note  Patient Details Name: Susan Khan MRN: [id number] DOB: 19-Dec-1940   Cancelled Treatment:    Reason Eval/Treat Not Completed: Patient declined, no reason specified. Pt declining PT evaluation at this time. Pt in the process of getting dressed to d/c home today. Pt's husband present and assisting her with donning clothes.   [name] [name] 01/21/2017, 11:58 AM

## 2017-01-21 NOTE — Discharge Summary (Signed)
Physician Discharge Summary   Patient ID: Susan Khan MRN: [id number] DOB/AGE: 01/27/1941 76 y.o.  Admit date: 01/20/2017 Discharge date: 01/21/2017  Admission Diagnoses:  Active Problems:   S/P shoulder replacement, right   Discharge Diagnoses:  Same   Surgeries: Procedure(s): REVERSE RIGHT SHOULDER ARTHROPLASTY on 01/20/2017   Consultants: OT  Discharged Condition: Stable  Hospital Course: Susan Khan is an 76 y.o. female who was admitted 01/20/2017 with a chief complaint of right shoulder pain, and found to have a diagnosis of right shoulder primary OA and rotator cuff insufficiency.  They were brought to the operating room on 01/20/2017 and underwent the above named procedures.    The patient had an uncomplicated hospital course and was stable for discharge.  Recent vital signs:  Vitals:   01/20/17 2059 01/21/17 0500  BP: 129/65 128/73  Pulse: 73 66  Resp: 16 16  Temp: 98.7 F (37.1 C) 97.6 F (36.4 C)    Recent laboratory studies:  Results for orders placed or performed during the hospital encounter of 01/20/17  Hemoglobin and hematocrit, blood  Result Value Ref Range   Hemoglobin 10.9 (L) 12.0 - 15.0 g/dL   HCT 40.9 (L) 81.1 - 91.4 %    Discharge Medications:   Allergies as of 01/21/2017      Reactions   No Known Allergies       Medication List    TAKE these medications   acetaminophen 500 MG tablet Commonly known as:  TYLENOL Take 500 mg by mouth 2 (two) times daily.   cholecalciferol 1000 units tablet Commonly known as:  VITAMIN D Take 1,000 Units by mouth daily.   CITRACAL + D PO Take 1 tablet by mouth daily.   HYDROcodone-acetaminophen 5-325 MG tablet Commonly known as:  NORCO Take 1-2 tablets by mouth every 4 (four) hours as needed for moderate pain.   levothyroxine 75 MCG tablet Commonly known as:  SYNTHROID, LEVOTHROID Take 75 mcg by mouth daily before breakfast.   losartan 25 MG tablet Commonly known as:  COZAAR Take  25 mg by mouth at bedtime.   lovastatin 40 MG tablet Commonly known as:  MEVACOR Take 40 mg by mouth at bedtime.   multivitamin with minerals Tabs tablet Take 1 tablet by mouth daily. For Women   omeprazole 20 MG capsule Commonly known as:  PRILOSEC Take 20 mg by mouth daily as needed (heartburn).   OPCON-A 0.027-0.315 % Soln Generic drug:  Naphazoline-Pheniramine Apply 1 drop to eye daily as needed (allergies).   SOOTHE XP Soln Apply 1 drop to eye daily as needed (dry eyes).       Diagnostic Studies: Dg Shoulder Right Port  Result Date: 01/20/2017 CLINICAL DATA:  Status post right shoulder replacement EXAM: PORTABLE RIGHT SHOULDER COMPARISON:  None. FINDINGS: Right shoulder replacement is noted in satisfactory position. No acute bony abnormality is noted. No soft tissue changes are seen. IMPRESSION: Status post right shoulder replacement. Electronically Signed   By: Alcide Clever M.D.   On: 01/20/2017 10:54    Disposition:   Discharge Instructions    Call MD / Call 911    Complete by:  As directed    If you experience chest pain or shortness of breath, CALL 911 and be transported to the hospital emergency room.  If you develope a fever above 101 F, pus (white drainage) or increased drainage or redness at the wound, or calf pain, call your surgeon's office.   Constipation Prevention    Complete by:  As directed    Drink plenty of fluids.  Prune juice may be helpful.  You may use a stool softener, such as Colace (over the counter) 100 mg twice a day.  Use MiraLax (over the counter) for constipation as needed.   Diet - low sodium heart healthy    Complete by:  As directed    Discharge instructions    Complete by:  As directed    Please send patient home with 4 by 4s and 2 inch paper tape to change bandage tomorrow and then daily   Increase activity slowly as tolerated    Complete by:  As directed       Follow-up Information    [facility],STEVEN R, MD. Call in 2 weeks.     Specialty:  Orthopedic Surgery Why:  [phone] Contact information: [address] [place] [address] [phone]            Signed: Verlee Rossetti 01/21/2017, 11:06 AM

## 2017-01-21 NOTE — Evaluation (Addendum)
Occupational Therapy Evaluation Patient Details Name: Susan Khan MRN: [id number] DOB: Apr 14, 1941 Today's Date: 01/21/2017    History of Present Illness Pt is Susan 76 y.o. 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 She has no significant PMH.   Clinical Impression   PTA, pt was independent with assistive devices for ADL and functional mobility and was utilizing Susan cane for community mobility. Pt currently requires min assist for UB ADL and supervision for toilet transfers to ensure safety and adherence to conservative shoulder protocol. Educated pt concerning dressing and bathing techniques, sling wear schedule, elbow/wrist/hand AROM and lap slides per MD order. She would benefit from continued OT services while admitted to improve independence with ADL and to continue conservative shoulder protocol HEP education. OT will continue to follow acutely.    Follow Up Recommendations  Supervision/Assistance - 24 hour (Progress rehab of shoulder per MD)    Equipment Recommendations  3 in 1 bedside commode    Recommendations for Other Services       Precautions / Restrictions Precautions Precautions: Shoulder Type of Shoulder Precautions: Conservative protocol: sling at all times, NWB R UE, no AROM/PROM R shoulder, "limit FF to 90 and ER to neutral, lap slides, gentle hand to face and ADL ok" Shoulder Interventions: Shoulder sling/immobilizer;At all times;Off for dressing/bathing/exercises Precaution Booklet Issued: Yes (comment) Precaution Comments: Educated pt on conservative shoulder protocol and ADL techniques. Required Braces or Orthoses: Sling Restrictions Weight Bearing Restrictions: Yes RUE Weight Bearing: Non weight bearing      Mobility Bed Mobility               General bed mobility comments: Sitting at EOB on OT arrival  Transfers Overall transfer level: Needs assistance Equipment used: None Transfers: Sit to/from Stand Sit to Stand:  Supervision         General transfer comment: Supervision for safety    Balance Overall balance assessment: Needs assistance Sitting-balance support: No upper extremity supported;Feet supported Sitting balance-Leahy Scale: Good     Standing balance support: No upper extremity supported;During functional activity Standing balance-Leahy Scale: Good                             ADL either performed or assessed with clinical judgement   ADL Overall ADL's : Needs assistance/impaired Eating/Feeding: Set up;Sitting   Grooming: Set up;Sitting   Upper Body Bathing: Minimal assistance;Sitting   Lower Body Bathing: Minimal assistance;Sit to/from stand   Upper Body Dressing : Minimal assistance;Sitting   Lower Body Dressing: Minimal assistance;Sit to/from stand   Toilet Transfer: Supervision/safety;Ambulation   Toileting- Clothing Manipulation and Hygiene: Supervision/safety;Sit to/from stand       Functional mobility during ADLs: Supervision/safety General ADL Comments: Educated pt on dressing/bathing techniques, sling wear schedule, elbow/wrist/hand exercises, and sleep positioning.     Vision Patient Visual Report: No change from baseline Vision Assessment?: No apparent visual deficits     Perception     Praxis      Pertinent Vitals/Pain Pain Assessment: Faces Faces Pain Scale: Hurts even more Pain Location: R shoulder Pain Descriptors / Indicators: Operative site guarding;Sore Pain Intervention(s): Limited activity within patient's tolerance;Repositioned;Patient requesting pain meds-RN notified     Hand Dominance Right   Extremity/Trunk Assessment Upper Extremity Assessment Upper Extremity Assessment: RUE deficits/detail RUE Deficits / Details: Decreased strength and ROM within parameters of protocol as expected post-operatively.   Lower Extremity Assessment Lower Extremity Assessment: Defer to PT evaluation  Communication  Communication Communication: No difficulties   Cognition Arousal/Alertness: Awake/alert Behavior During Therapy: WFL for tasks assessed/performed Overall Cognitive Status: Within Functional Limits for tasks assessed                                     General Comments       Exercises Exercises: Shoulder Shoulder Exercises Elbow Flexion: AROM;Right;10 reps;Standing Wrist Flexion: AROM;Right;10 reps;Seated Digit Composite Flexion: AROM;Right;10 reps;Seated Donning/doffing shirt without moving shoulder: Minimal assistance Method for sponge bathing under operated UE: Minimal assistance Donning/doffing sling/immobilizer: Minimal assistance Correct positioning of sling/immobilizer: Supervision/safety ROM for elbow, wrist and digits of operated UE: Supervision/safety Sling wearing schedule (on at all times/off for ADL's): Supervision/safety Proper positioning of operated UE when showering: Minimal assistance Positioning of UE while sleeping: Minimal assistance   Shoulder Instructions Shoulder Instructions Donning/doffing shirt without moving shoulder: Minimal assistance Method for sponge bathing under operated UE: Minimal assistance Donning/doffing sling/immobilizer: Minimal assistance Correct positioning of sling/immobilizer: Supervision/safety ROM for elbow, wrist and digits of operated UE: Supervision/safety Sling wearing schedule (on at all times/off for ADL's): Supervision/safety Proper positioning of operated UE when showering: Minimal assistance Positioning of UE while sleeping: Minimal assistance    Home Living Family/patient expects to be discharged to:: Private residence Living Arrangements: Spouse/significant other Available Help at Discharge: Family;Available 24 hours/day Type of Home: House Home Access: Stairs to enter [employer] of Steps: 4   Home Layout: One level     Bathroom Shower/Tub: Chief Strategy Officer:  Standard Bathroom Accessibility: Yes   Home Equipment: Cane - single point          Prior Functioning/Environment Level of Independence: Independent with assistive device(s)        Comments: Uses cane in community for stability due to R knee problems.        OT Problem List: Decreased strength;Decreased range of motion;Decreased activity tolerance;Impaired balance (sitting and/or standing);Decreased safety awareness;Decreased knowledge of use of DME or AE;Decreased knowledge of precautions;Pain;Impaired UE functional use      OT Treatment/Interventions: Self-care/ADL training;Therapeutic exercise;Energy conservation;DME and/or AE instruction;Therapeutic activities;Patient/family education;Balance training    OT Goals(Current goals can be found in the care plan section) Acute Rehab OT Goals Patient Stated Goal: to go home OT Goal Formulation: With patient Time For Goal Achievement: 02/04/17 Potential to Achieve Goals: Good ADL Goals Pt Will Perform Upper Body Bathing: with supervision;sitting Pt Will Perform Upper Body Dressing: with supervision;sitting Pt/caregiver will Perform Home Exercise Program: Right Upper extremity;With Supervision;With written HEP provided (elbow/wrist/hand AROM)  OT Frequency: Min 1X/week   Barriers to D/C:            Co-evaluation              End of Session Equipment Utilized During Treatment:  (R shoulder sling) Nurse Communication: Mobility status  Activity Tolerance: Patient tolerated treatment well Patient left: in chair;with call bell/phone within reach  OT Visit Diagnosis: Pain Pain - Right/Left: Right Pain - part of body: Shoulder                Time: 6213-0865 OT Time Calculation (min): 19 min Charges:  OT General Charges $OT Visit: 1 Procedure OT Evaluation $OT Eval Moderate Complexity: 1 Procedure G-Codes:     Doristine Section, MS OTR/L  Pager: [phone]   Susan Khan Susan Khan 01/21/2017, 10:29 AM

## 2017-01-21 NOTE — Progress Notes (Signed)
Orthopedics Progress Note  Subjective: Patient feeling better this morning  Objective:  Vitals:   01/20/17 2059 01/21/17 0500  BP: 129/65 128/73  Pulse: 73 66  Resp: 16 16  Temp: 98.7 F (37.1 C) 97.6 F (36.4 C)    General: Awake and alert  Musculoskeletal: right shoulder dressing CDI, arm with min swelling, NVI, wiggles fingers, sensation intact Neurovascularly intact  Lab Results  Component Value Date   WBC 6.8 01/13/2017   HGB 10.9 (L) 01/21/2017   HCT 32.7 (L) 01/21/2017   MCV 96.0 01/13/2017   PLT 223 01/13/2017       Component Value Date/Time   NA 137 01/13/2017 1035   NA 140 09/14/2012 0550   K 4.3 01/13/2017 1035   K 4.0 09/14/2012 0550   CL 101 01/13/2017 1035   CL 106 09/14/2012 0550   CO2 27 01/13/2017 1035   CO2 29 09/14/2012 0550   GLUCOSE 87 01/13/2017 1035   GLUCOSE 100 (H) 09/14/2012 0550   BUN 9 01/13/2017 1035   BUN 8 09/14/2012 0550   CREATININE 0.81 01/13/2017 1035   CREATININE 0.82 09/14/2012 0550   CALCIUM 9.7 01/13/2017 1035   CALCIUM 8.8 09/14/2012 0550   GFRNONAA >60 01/13/2017 1035   GFRNONAA >60 09/14/2012 0550   GFRAA >60 01/13/2017 1035   GFRAA >60 09/14/2012 0550    Lab Results  Component Value Date   INR 0.9 08/27/2012   INR 0.9 05/14/2012    Assessment/Plan: POD #1 s/p Procedure(s): REVERSE RIGHT SHOULDER ARTHROPLASTY, stable Progress activity slowly, change bandage tomorrow Exercises as instructed D/C home  Almedia Balls. Ranell Patrick, MD 01/21/2017 11:03 AM

## 2017-01-21 NOTE — Discharge Instructions (Signed)
Ice to the shoulder as much as you can.  Keep it covered with a bandage and do not get wet in the shower for one week, then ok to get it wet.  Ok to use the shoulder and arm for light activity.  Do not reach behind you or push your full weight up out of a chair with the right arm.  Use the sling as you need to for comfort, may remove as you would like.  Follow up with Dr Ranell Patrick in two weeks in the office.  [phone]  Bandage change tomorrow and then daily with gauze and tape

## 2017-01-23 ENCOUNTER — Encounter (HOSPITAL_COMMUNITY): Payer: Self-pay | Admitting: Orthopedic Surgery

## 2017-02-06 DIAGNOSIS — Z96611 Presence of right artificial shoulder joint: Secondary | ICD-10-CM | POA: Diagnosis not present

## 2017-02-06 DIAGNOSIS — Z471 Aftercare following joint replacement surgery: Secondary | ICD-10-CM | POA: Diagnosis not present

## 2017-03-07 DIAGNOSIS — Z96611 Presence of right artificial shoulder joint: Secondary | ICD-10-CM | POA: Diagnosis not present

## 2017-03-07 DIAGNOSIS — Z471 Aftercare following joint replacement surgery: Secondary | ICD-10-CM | POA: Diagnosis not present

## 2017-03-15 DIAGNOSIS — K219 Gastro-esophageal reflux disease without esophagitis: Secondary | ICD-10-CM | POA: Diagnosis not present

## 2017-03-15 DIAGNOSIS — I10 Essential (primary) hypertension: Secondary | ICD-10-CM | POA: Diagnosis not present

## 2017-03-15 DIAGNOSIS — D649 Anemia, unspecified: Secondary | ICD-10-CM | POA: Diagnosis not present

## 2017-03-22 ENCOUNTER — Other Ambulatory Visit: Payer: Self-pay | Admitting: Internal Medicine

## 2017-03-22 DIAGNOSIS — M5136 Other intervertebral disc degeneration, lumbar region: Secondary | ICD-10-CM | POA: Diagnosis not present

## 2017-03-22 DIAGNOSIS — D649 Anemia, unspecified: Secondary | ICD-10-CM | POA: Diagnosis not present

## 2017-03-22 DIAGNOSIS — E034 Atrophy of thyroid (acquired): Secondary | ICD-10-CM | POA: Diagnosis not present

## 2017-03-22 DIAGNOSIS — K219 Gastro-esophageal reflux disease without esophagitis: Secondary | ICD-10-CM | POA: Diagnosis not present

## 2017-03-22 DIAGNOSIS — Z1231 Encounter for screening mammogram for malignant neoplasm of breast: Secondary | ICD-10-CM

## 2017-03-22 DIAGNOSIS — M150 Primary generalized (osteo)arthritis: Secondary | ICD-10-CM | POA: Diagnosis not present

## 2017-03-22 DIAGNOSIS — I10 Essential (primary) hypertension: Secondary | ICD-10-CM | POA: Diagnosis not present

## 2017-03-22 DIAGNOSIS — E784 Other hyperlipidemia: Secondary | ICD-10-CM | POA: Diagnosis not present

## 2017-04-11 DIAGNOSIS — M25311 Other instability, right shoulder: Secondary | ICD-10-CM | POA: Diagnosis not present

## 2017-04-11 DIAGNOSIS — M19011 Primary osteoarthritis, right shoulder: Secondary | ICD-10-CM | POA: Diagnosis not present

## 2017-04-11 DIAGNOSIS — Z96611 Presence of right artificial shoulder joint: Secondary | ICD-10-CM | POA: Diagnosis not present

## 2017-04-11 DIAGNOSIS — Z471 Aftercare following joint replacement surgery: Secondary | ICD-10-CM | POA: Diagnosis not present

## 2017-04-12 ENCOUNTER — Ambulatory Visit
Admission: RE | Admit: 2017-04-12 | Discharge: 2017-04-12 | Disposition: A | Discharge status: Home / Self Care | Payer: Medicare HMO | Source: Ambulatory Visit | Attending: Internal Medicine | Admitting: Internal Medicine

## 2017-04-12 DIAGNOSIS — Z1231 Encounter for screening mammogram for malignant neoplasm of breast: Secondary | ICD-10-CM | POA: Diagnosis not present

## 2017-07-13 DIAGNOSIS — Z471 Aftercare following joint replacement surgery: Secondary | ICD-10-CM | POA: Diagnosis not present

## 2017-07-13 DIAGNOSIS — Z96611 Presence of right artificial shoulder joint: Secondary | ICD-10-CM | POA: Diagnosis not present

## 2017-08-29 DIAGNOSIS — E034 Atrophy of thyroid (acquired): Secondary | ICD-10-CM | POA: Diagnosis not present

## 2017-08-29 DIAGNOSIS — M5136 Other intervertebral disc degeneration, lumbar region: Secondary | ICD-10-CM | POA: Diagnosis not present

## 2017-08-29 DIAGNOSIS — I10 Essential (primary) hypertension: Secondary | ICD-10-CM | POA: Diagnosis not present

## 2017-08-29 DIAGNOSIS — E7849 Other hyperlipidemia: Secondary | ICD-10-CM | POA: Diagnosis not present

## 2017-08-29 DIAGNOSIS — D649 Anemia, unspecified: Secondary | ICD-10-CM | POA: Diagnosis not present

## 2017-08-30 DIAGNOSIS — Z23 Encounter for immunization: Secondary | ICD-10-CM | POA: Diagnosis not present

## 2017-09-05 DIAGNOSIS — D649 Anemia, unspecified: Secondary | ICD-10-CM | POA: Diagnosis not present

## 2017-09-05 DIAGNOSIS — K219 Gastro-esophageal reflux disease without esophagitis: Secondary | ICD-10-CM | POA: Diagnosis not present

## 2017-09-05 DIAGNOSIS — E034 Atrophy of thyroid (acquired): Secondary | ICD-10-CM | POA: Diagnosis not present

## 2017-09-05 DIAGNOSIS — I10 Essential (primary) hypertension: Secondary | ICD-10-CM | POA: Diagnosis not present

## 2017-09-05 DIAGNOSIS — Z0000 Encounter for general adult medical examination without abnormal findings: Secondary | ICD-10-CM | POA: Diagnosis not present

## 2017-09-05 DIAGNOSIS — E7849 Other hyperlipidemia: Secondary | ICD-10-CM | POA: Diagnosis not present

## 2017-09-05 DIAGNOSIS — M5136 Other intervertebral disc degeneration, lumbar region: Secondary | ICD-10-CM | POA: Diagnosis not present

## 2017-11-20 ENCOUNTER — Ambulatory Visit: Payer: Self-pay | Admitting: Podiatry

## 2018-01-29 DIAGNOSIS — J029 Acute pharyngitis, unspecified: Secondary | ICD-10-CM | POA: Diagnosis not present

## 2018-01-29 DIAGNOSIS — J40 Bronchitis, not specified as acute or chronic: Secondary | ICD-10-CM | POA: Diagnosis not present

## 2018-02-28 DIAGNOSIS — M5136 Other intervertebral disc degeneration, lumbar region: Secondary | ICD-10-CM | POA: Diagnosis not present

## 2018-02-28 DIAGNOSIS — I10 Essential (primary) hypertension: Secondary | ICD-10-CM | POA: Diagnosis not present

## 2018-02-28 DIAGNOSIS — E034 Atrophy of thyroid (acquired): Secondary | ICD-10-CM | POA: Diagnosis not present

## 2018-02-28 DIAGNOSIS — D649 Anemia, unspecified: Secondary | ICD-10-CM | POA: Diagnosis not present

## 2018-02-28 DIAGNOSIS — E7849 Other hyperlipidemia: Secondary | ICD-10-CM | POA: Diagnosis not present

## 2018-03-07 DIAGNOSIS — E034 Atrophy of thyroid (acquired): Secondary | ICD-10-CM | POA: Diagnosis not present

## 2018-03-07 DIAGNOSIS — K219 Gastro-esophageal reflux disease without esophagitis: Secondary | ICD-10-CM | POA: Diagnosis not present

## 2018-03-07 DIAGNOSIS — M5136 Other intervertebral disc degeneration, lumbar region: Secondary | ICD-10-CM | POA: Diagnosis not present

## 2018-03-07 DIAGNOSIS — D649 Anemia, unspecified: Secondary | ICD-10-CM | POA: Diagnosis not present

## 2018-03-07 DIAGNOSIS — E7849 Other hyperlipidemia: Secondary | ICD-10-CM | POA: Diagnosis not present

## 2018-03-07 DIAGNOSIS — I10 Essential (primary) hypertension: Secondary | ICD-10-CM | POA: Diagnosis not present

## 2018-03-21 DIAGNOSIS — Z1211 Encounter for screening for malignant neoplasm of colon: Secondary | ICD-10-CM | POA: Diagnosis not present

## 2018-03-21 DIAGNOSIS — D649 Anemia, unspecified: Secondary | ICD-10-CM | POA: Diagnosis not present

## 2018-03-29 ENCOUNTER — Other Ambulatory Visit: Payer: Self-pay | Admitting: Internal Medicine

## 2018-03-29 DIAGNOSIS — Z1231 Encounter for screening mammogram for malignant neoplasm of breast: Secondary | ICD-10-CM

## 2018-04-09 DIAGNOSIS — D649 Anemia, unspecified: Secondary | ICD-10-CM | POA: Diagnosis not present

## 2018-04-16 ENCOUNTER — Ambulatory Visit
Admission: RE | Admit: 2018-04-16 | Discharge: 2018-04-16 | Disposition: A | Discharge status: Home / Self Care | Payer: Medicare HMO | Source: Ambulatory Visit | Attending: Internal Medicine | Admitting: Internal Medicine

## 2018-04-16 DIAGNOSIS — Z1231 Encounter for screening mammogram for malignant neoplasm of breast: Secondary | ICD-10-CM | POA: Diagnosis not present

## 2018-05-09 DIAGNOSIS — D649 Anemia, unspecified: Secondary | ICD-10-CM | POA: Diagnosis not present

## 2018-08-15 DIAGNOSIS — Z23 Encounter for immunization: Secondary | ICD-10-CM | POA: Diagnosis not present

## 2018-09-03 DIAGNOSIS — D649 Anemia, unspecified: Secondary | ICD-10-CM | POA: Diagnosis not present

## 2018-09-03 DIAGNOSIS — E034 Atrophy of thyroid (acquired): Secondary | ICD-10-CM | POA: Diagnosis not present

## 2018-09-03 DIAGNOSIS — I10 Essential (primary) hypertension: Secondary | ICD-10-CM | POA: Diagnosis not present

## 2018-09-03 DIAGNOSIS — E7849 Other hyperlipidemia: Secondary | ICD-10-CM | POA: Diagnosis not present

## 2018-09-10 DIAGNOSIS — M5136 Other intervertebral disc degeneration, lumbar region: Secondary | ICD-10-CM | POA: Diagnosis not present

## 2018-09-10 DIAGNOSIS — R202 Paresthesia of skin: Secondary | ICD-10-CM | POA: Diagnosis not present

## 2018-09-10 DIAGNOSIS — Z0000 Encounter for general adult medical examination without abnormal findings: Secondary | ICD-10-CM | POA: Diagnosis not present

## 2018-09-10 DIAGNOSIS — R200 Anesthesia of skin: Secondary | ICD-10-CM | POA: Diagnosis not present

## 2018-09-10 DIAGNOSIS — K219 Gastro-esophageal reflux disease without esophagitis: Secondary | ICD-10-CM | POA: Diagnosis not present

## 2018-09-10 DIAGNOSIS — I10 Essential (primary) hypertension: Secondary | ICD-10-CM | POA: Diagnosis not present

## 2018-09-10 DIAGNOSIS — E034 Atrophy of thyroid (acquired): Secondary | ICD-10-CM | POA: Diagnosis not present

## 2018-09-10 DIAGNOSIS — D649 Anemia, unspecified: Secondary | ICD-10-CM | POA: Diagnosis not present

## 2018-09-10 DIAGNOSIS — M150 Primary generalized (osteo)arthritis: Secondary | ICD-10-CM | POA: Diagnosis not present

## 2018-09-17 DIAGNOSIS — M8588 Other specified disorders of bone density and structure, other site: Secondary | ICD-10-CM | POA: Diagnosis not present

## 2019-03-04 DIAGNOSIS — E7849 Other hyperlipidemia: Secondary | ICD-10-CM | POA: Diagnosis not present

## 2019-03-04 DIAGNOSIS — I10 Essential (primary) hypertension: Secondary | ICD-10-CM | POA: Diagnosis not present

## 2019-03-04 DIAGNOSIS — D649 Anemia, unspecified: Secondary | ICD-10-CM | POA: Diagnosis not present

## 2019-03-11 DIAGNOSIS — I10 Essential (primary) hypertension: Secondary | ICD-10-CM | POA: Diagnosis not present

## 2019-03-11 DIAGNOSIS — D649 Anemia, unspecified: Secondary | ICD-10-CM | POA: Diagnosis not present

## 2019-03-11 DIAGNOSIS — E034 Atrophy of thyroid (acquired): Secondary | ICD-10-CM | POA: Diagnosis not present

## 2019-03-11 DIAGNOSIS — K219 Gastro-esophageal reflux disease without esophagitis: Secondary | ICD-10-CM | POA: Diagnosis not present

## 2019-03-11 DIAGNOSIS — E7849 Other hyperlipidemia: Secondary | ICD-10-CM | POA: Diagnosis not present

## 2019-03-11 DIAGNOSIS — M5136 Other intervertebral disc degeneration, lumbar region: Secondary | ICD-10-CM | POA: Diagnosis not present

## 2019-04-02 ENCOUNTER — Other Ambulatory Visit: Payer: Self-pay | Admitting: Internal Medicine

## 2019-04-02 DIAGNOSIS — Z1231 Encounter for screening mammogram for malignant neoplasm of breast: Secondary | ICD-10-CM

## 2019-04-03 DIAGNOSIS — E034 Atrophy of thyroid (acquired): Secondary | ICD-10-CM | POA: Diagnosis not present

## 2019-04-03 DIAGNOSIS — I10 Essential (primary) hypertension: Secondary | ICD-10-CM | POA: Diagnosis not present

## 2019-04-15 DIAGNOSIS — M5136 Other intervertebral disc degeneration, lumbar region: Secondary | ICD-10-CM | POA: Diagnosis not present

## 2019-04-15 DIAGNOSIS — I10 Essential (primary) hypertension: Secondary | ICD-10-CM | POA: Diagnosis not present

## 2019-04-15 DIAGNOSIS — M8949 Other hypertrophic osteoarthropathy, multiple sites: Secondary | ICD-10-CM | POA: Diagnosis not present

## 2019-04-15 DIAGNOSIS — D649 Anemia, unspecified: Secondary | ICD-10-CM | POA: Diagnosis not present

## 2019-04-15 DIAGNOSIS — E034 Atrophy of thyroid (acquired): Secondary | ICD-10-CM | POA: Diagnosis not present

## 2019-04-15 DIAGNOSIS — K219 Gastro-esophageal reflux disease without esophagitis: Secondary | ICD-10-CM | POA: Diagnosis not present

## 2019-04-15 DIAGNOSIS — E7849 Other hyperlipidemia: Secondary | ICD-10-CM | POA: Diagnosis not present

## 2019-04-22 ENCOUNTER — Other Ambulatory Visit: Payer: Self-pay

## 2019-04-22 ENCOUNTER — Ambulatory Visit
Admission: RE | Admit: 2019-04-22 | Discharge: 2019-04-22 | Disposition: A | Discharge status: Home / Self Care | Payer: Medicare HMO | Source: Ambulatory Visit | Attending: Internal Medicine | Admitting: Internal Medicine

## 2019-04-22 DIAGNOSIS — Z1231 Encounter for screening mammogram for malignant neoplasm of breast: Secondary | ICD-10-CM | POA: Diagnosis not present

## 2019-06-06 DIAGNOSIS — R200 Anesthesia of skin: Secondary | ICD-10-CM | POA: Diagnosis not present

## 2019-08-14 DIAGNOSIS — Z23 Encounter for immunization: Secondary | ICD-10-CM | POA: Diagnosis not present

## 2019-09-05 DIAGNOSIS — M5136 Other intervertebral disc degeneration, lumbar region: Secondary | ICD-10-CM | POA: Diagnosis not present

## 2019-09-05 DIAGNOSIS — D649 Anemia, unspecified: Secondary | ICD-10-CM | POA: Diagnosis not present

## 2019-09-05 DIAGNOSIS — E034 Atrophy of thyroid (acquired): Secondary | ICD-10-CM | POA: Diagnosis not present

## 2019-09-05 DIAGNOSIS — E7849 Other hyperlipidemia: Secondary | ICD-10-CM | POA: Diagnosis not present

## 2019-09-05 DIAGNOSIS — I10 Essential (primary) hypertension: Secondary | ICD-10-CM | POA: Diagnosis not present

## 2019-09-12 DIAGNOSIS — D649 Anemia, unspecified: Secondary | ICD-10-CM | POA: Diagnosis not present

## 2019-09-12 DIAGNOSIS — E038 Other specified hypothyroidism: Secondary | ICD-10-CM | POA: Diagnosis not present

## 2019-09-12 DIAGNOSIS — I10 Essential (primary) hypertension: Secondary | ICD-10-CM | POA: Diagnosis not present

## 2019-09-12 DIAGNOSIS — G629 Polyneuropathy, unspecified: Secondary | ICD-10-CM | POA: Diagnosis not present

## 2019-09-12 DIAGNOSIS — Z0000 Encounter for general adult medical examination without abnormal findings: Secondary | ICD-10-CM | POA: Diagnosis not present

## 2019-09-12 DIAGNOSIS — E034 Atrophy of thyroid (acquired): Secondary | ICD-10-CM | POA: Diagnosis not present

## 2019-09-12 DIAGNOSIS — E7849 Other hyperlipidemia: Secondary | ICD-10-CM | POA: Diagnosis not present

## 2019-09-12 DIAGNOSIS — Z6839 Body mass index (BMI) 39.0-39.9, adult: Secondary | ICD-10-CM | POA: Diagnosis not present

## 2020-01-31 ENCOUNTER — Other Ambulatory Visit: Payer: Self-pay

## 2020-01-31 ENCOUNTER — Ambulatory Visit: Payer: Medicare HMO | Attending: Internal Medicine

## 2020-01-31 DIAGNOSIS — Z23 Encounter for immunization: Secondary | ICD-10-CM

## 2020-01-31 NOTE — Progress Notes (Signed)
   Covid-19 Vaccination Clinic  Name:  Jaonna Word    MRN: [id number] DOB: 1941/03/16  01/31/2020  Ms. Beedy was observed post Covid-19 immunization for 15 minutes without incident. She was provided with Vaccine Information Sheet and instruction to access the V-Safe system.   Ms. Reali was instructed to call 911 with any severe reactions post vaccine: Marland Kitchen Difficulty breathing  . Swelling of face and throat  . A fast heartbeat  . A bad rash all over body  . Dizziness and weakness   Immunizations Administered    Name Date Dose VIS Date Route   Pfizer COVID-19 Vaccine 01/31/2020 10:46 AM 0.3 mL 10/04/2019 Intramuscular   Manufacturer: [employer], [employer]   Lot: [id number]   NDC: [address]-0

## 2020-02-26 ENCOUNTER — Ambulatory Visit: Payer: Medicare HMO | Attending: Internal Medicine

## 2020-02-26 DIAGNOSIS — Z23 Encounter for immunization: Secondary | ICD-10-CM

## 2020-02-26 NOTE — Progress Notes (Signed)
   Covid-19 Vaccination Clinic  Name:  Susan Khan    MRN: [id number] DOB: March 30, 1941  02/26/2020  Susan Khan was observed post Covid-19 immunization for 15 minutes without incident. She was provided with Vaccine Information Sheet and instruction to access the V-Safe system.   Susan Khan was instructed to call 911 with any severe reactions post vaccine: Marland Kitchen Difficulty breathing  . Swelling of face and throat  . A fast heartbeat  . A bad rash all over body  . Dizziness and weakness   Immunizations Administered    Name Date Dose VIS Date Route   Pfizer COVID-19 Vaccine 02/26/2020  8:38 AM 0.3 mL 12/18/2018 Intramuscular   Manufacturer: [employer], [employer]   Lot: [id number]   NDC: [address]-8

## 2020-03-04 DIAGNOSIS — D649 Anemia, unspecified: Secondary | ICD-10-CM | POA: Diagnosis not present

## 2020-03-04 DIAGNOSIS — E611 Iron deficiency: Secondary | ICD-10-CM | POA: Diagnosis not present

## 2020-03-04 DIAGNOSIS — I10 Essential (primary) hypertension: Secondary | ICD-10-CM | POA: Diagnosis not present

## 2020-03-04 DIAGNOSIS — E7849 Other hyperlipidemia: Secondary | ICD-10-CM | POA: Diagnosis not present

## 2020-03-04 DIAGNOSIS — E034 Atrophy of thyroid (acquired): Secondary | ICD-10-CM | POA: Diagnosis not present

## 2020-03-04 DIAGNOSIS — Z1159 Encounter for screening for other viral diseases: Secondary | ICD-10-CM | POA: Diagnosis not present

## 2020-03-13 DIAGNOSIS — E611 Iron deficiency: Secondary | ICD-10-CM | POA: Diagnosis not present

## 2020-03-13 DIAGNOSIS — I10 Essential (primary) hypertension: Secondary | ICD-10-CM | POA: Diagnosis not present

## 2020-03-13 DIAGNOSIS — G629 Polyneuropathy, unspecified: Secondary | ICD-10-CM | POA: Diagnosis not present

## 2020-03-13 DIAGNOSIS — K219 Gastro-esophageal reflux disease without esophagitis: Secondary | ICD-10-CM | POA: Diagnosis not present

## 2020-03-13 DIAGNOSIS — Z6838 Body mass index (BMI) 38.0-38.9, adult: Secondary | ICD-10-CM | POA: Diagnosis not present

## 2020-03-13 DIAGNOSIS — M5136 Other intervertebral disc degeneration, lumbar region: Secondary | ICD-10-CM | POA: Diagnosis not present

## 2020-03-13 DIAGNOSIS — E034 Atrophy of thyroid (acquired): Secondary | ICD-10-CM | POA: Diagnosis not present

## 2020-03-13 DIAGNOSIS — E7849 Other hyperlipidemia: Secondary | ICD-10-CM | POA: Diagnosis not present

## 2020-03-24 ENCOUNTER — Other Ambulatory Visit: Payer: Self-pay | Admitting: Internal Medicine

## 2020-03-24 DIAGNOSIS — Z1231 Encounter for screening mammogram for malignant neoplasm of breast: Secondary | ICD-10-CM

## 2020-04-23 ENCOUNTER — Ambulatory Visit
Admission: RE | Admit: 2020-04-23 | Discharge: 2020-04-23 | Disposition: A | Discharge status: Home / Self Care | Payer: Medicare HMO | Source: Ambulatory Visit | Attending: Internal Medicine | Admitting: Internal Medicine

## 2020-04-23 DIAGNOSIS — Z1231 Encounter for screening mammogram for malignant neoplasm of breast: Secondary | ICD-10-CM | POA: Insufficient documentation

## 2020-09-16 DIAGNOSIS — I10 Essential (primary) hypertension: Secondary | ICD-10-CM | POA: Diagnosis not present

## 2020-09-16 DIAGNOSIS — D649 Anemia, unspecified: Secondary | ICD-10-CM | POA: Diagnosis not present

## 2020-09-16 DIAGNOSIS — E611 Iron deficiency: Secondary | ICD-10-CM | POA: Diagnosis not present

## 2020-09-16 DIAGNOSIS — E7849 Other hyperlipidemia: Secondary | ICD-10-CM | POA: Diagnosis not present

## 2020-09-23 DIAGNOSIS — Z23 Encounter for immunization: Secondary | ICD-10-CM | POA: Diagnosis not present

## 2020-09-23 DIAGNOSIS — E611 Iron deficiency: Secondary | ICD-10-CM | POA: Diagnosis not present

## 2020-09-23 DIAGNOSIS — D649 Anemia, unspecified: Secondary | ICD-10-CM | POA: Diagnosis not present

## 2020-09-23 DIAGNOSIS — E039 Hypothyroidism, unspecified: Secondary | ICD-10-CM | POA: Diagnosis not present

## 2020-09-23 DIAGNOSIS — I10 Essential (primary) hypertension: Secondary | ICD-10-CM | POA: Diagnosis not present

## 2020-09-23 DIAGNOSIS — Z0000 Encounter for general adult medical examination without abnormal findings: Secondary | ICD-10-CM | POA: Diagnosis not present

## 2020-09-23 DIAGNOSIS — K219 Gastro-esophageal reflux disease without esophagitis: Secondary | ICD-10-CM | POA: Diagnosis not present

## 2020-09-23 DIAGNOSIS — E785 Hyperlipidemia, unspecified: Secondary | ICD-10-CM | POA: Diagnosis not present

## 2020-10-14 DIAGNOSIS — M8588 Other specified disorders of bone density and structure, other site: Secondary | ICD-10-CM | POA: Diagnosis not present

## 2021-03-17 DIAGNOSIS — I10 Essential (primary) hypertension: Secondary | ICD-10-CM | POA: Diagnosis not present

## 2021-03-17 DIAGNOSIS — E611 Iron deficiency: Secondary | ICD-10-CM | POA: Diagnosis not present

## 2021-03-17 DIAGNOSIS — D649 Anemia, unspecified: Secondary | ICD-10-CM | POA: Diagnosis not present

## 2021-03-17 DIAGNOSIS — E7849 Other hyperlipidemia: Secondary | ICD-10-CM | POA: Diagnosis not present

## 2021-03-25 DIAGNOSIS — I10 Essential (primary) hypertension: Secondary | ICD-10-CM | POA: Diagnosis not present

## 2021-03-25 DIAGNOSIS — D649 Anemia, unspecified: Secondary | ICD-10-CM | POA: Diagnosis not present

## 2021-03-25 DIAGNOSIS — K219 Gastro-esophageal reflux disease without esophagitis: Secondary | ICD-10-CM | POA: Diagnosis not present

## 2021-03-25 DIAGNOSIS — E7849 Other hyperlipidemia: Secondary | ICD-10-CM | POA: Diagnosis not present

## 2021-03-25 DIAGNOSIS — M5136 Other intervertebral disc degeneration, lumbar region: Secondary | ICD-10-CM | POA: Diagnosis not present

## 2021-03-25 DIAGNOSIS — E034 Atrophy of thyroid (acquired): Secondary | ICD-10-CM | POA: Diagnosis not present

## 2021-03-25 DIAGNOSIS — Z6837 Body mass index (BMI) 37.0-37.9, adult: Secondary | ICD-10-CM | POA: Diagnosis not present

## 2021-04-19 ENCOUNTER — Other Ambulatory Visit: Payer: Self-pay | Admitting: Internal Medicine

## 2021-04-19 DIAGNOSIS — Z1231 Encounter for screening mammogram for malignant neoplasm of breast: Secondary | ICD-10-CM

## 2021-04-27 ENCOUNTER — Ambulatory Visit
Admission: RE | Admit: 2021-04-27 | Discharge: 2021-04-27 | Disposition: A | Discharge status: Home / Self Care | Payer: Medicare HMO | Source: Ambulatory Visit | Attending: Internal Medicine | Admitting: Internal Medicine

## 2021-04-27 ENCOUNTER — Other Ambulatory Visit: Payer: Self-pay

## 2021-04-27 DIAGNOSIS — Z1231 Encounter for screening mammogram for malignant neoplasm of breast: Secondary | ICD-10-CM | POA: Diagnosis not present

## 2021-07-29 DIAGNOSIS — Z23 Encounter for immunization: Secondary | ICD-10-CM | POA: Diagnosis not present

## 2021-09-23 DIAGNOSIS — E034 Atrophy of thyroid (acquired): Secondary | ICD-10-CM | POA: Diagnosis not present

## 2021-09-23 DIAGNOSIS — I10 Essential (primary) hypertension: Secondary | ICD-10-CM | POA: Diagnosis not present

## 2021-09-23 DIAGNOSIS — E7849 Other hyperlipidemia: Secondary | ICD-10-CM | POA: Diagnosis not present

## 2021-09-23 DIAGNOSIS — D649 Anemia, unspecified: Secondary | ICD-10-CM | POA: Diagnosis not present

## 2021-09-30 DIAGNOSIS — I10 Essential (primary) hypertension: Secondary | ICD-10-CM | POA: Diagnosis not present

## 2021-09-30 DIAGNOSIS — E034 Atrophy of thyroid (acquired): Secondary | ICD-10-CM | POA: Diagnosis not present

## 2021-09-30 DIAGNOSIS — Z23 Encounter for immunization: Secondary | ICD-10-CM | POA: Diagnosis not present

## 2021-09-30 DIAGNOSIS — Z1389 Encounter for screening for other disorder: Secondary | ICD-10-CM | POA: Diagnosis not present

## 2021-09-30 DIAGNOSIS — D509 Iron deficiency anemia, unspecified: Secondary | ICD-10-CM | POA: Diagnosis not present

## 2021-09-30 DIAGNOSIS — E785 Hyperlipidemia, unspecified: Secondary | ICD-10-CM | POA: Diagnosis not present

## 2021-09-30 DIAGNOSIS — Z0000 Encounter for general adult medical examination without abnormal findings: Secondary | ICD-10-CM | POA: Diagnosis not present

## 2021-09-30 DIAGNOSIS — K219 Gastro-esophageal reflux disease without esophagitis: Secondary | ICD-10-CM | POA: Diagnosis not present

## 2022-03-24 DIAGNOSIS — I10 Essential (primary) hypertension: Secondary | ICD-10-CM | POA: Diagnosis not present

## 2022-03-24 DIAGNOSIS — R739 Hyperglycemia, unspecified: Secondary | ICD-10-CM | POA: Diagnosis not present

## 2022-03-24 DIAGNOSIS — E7849 Other hyperlipidemia: Secondary | ICD-10-CM | POA: Diagnosis not present

## 2022-03-24 DIAGNOSIS — D649 Anemia, unspecified: Secondary | ICD-10-CM | POA: Diagnosis not present

## 2022-03-30 DIAGNOSIS — R7303 Prediabetes: Secondary | ICD-10-CM | POA: Insufficient documentation

## 2022-03-31 DIAGNOSIS — E785 Hyperlipidemia, unspecified: Secondary | ICD-10-CM | POA: Diagnosis not present

## 2022-03-31 DIAGNOSIS — M519 Unspecified thoracic, thoracolumbar and lumbosacral intervertebral disc disorder: Secondary | ICD-10-CM | POA: Diagnosis not present

## 2022-03-31 DIAGNOSIS — E039 Hypothyroidism, unspecified: Secondary | ICD-10-CM | POA: Diagnosis not present

## 2022-03-31 DIAGNOSIS — R7303 Prediabetes: Secondary | ICD-10-CM | POA: Diagnosis not present

## 2022-03-31 DIAGNOSIS — D649 Anemia, unspecified: Secondary | ICD-10-CM | POA: Diagnosis not present

## 2022-03-31 DIAGNOSIS — K219 Gastro-esophageal reflux disease without esophagitis: Secondary | ICD-10-CM | POA: Diagnosis not present

## 2022-03-31 DIAGNOSIS — I10 Essential (primary) hypertension: Secondary | ICD-10-CM | POA: Diagnosis not present

## 2022-04-14 DIAGNOSIS — R7303 Prediabetes: Secondary | ICD-10-CM | POA: Diagnosis not present

## 2022-04-22 ENCOUNTER — Other Ambulatory Visit: Payer: Self-pay | Admitting: Internal Medicine

## 2022-04-22 DIAGNOSIS — Z1231 Encounter for screening mammogram for malignant neoplasm of breast: Secondary | ICD-10-CM

## 2022-05-18 ENCOUNTER — Ambulatory Visit
Admission: RE | Admit: 2022-05-18 | Discharge: 2022-05-18 | Disposition: A | Discharge status: Home / Self Care | Payer: Medicare HMO | Source: Ambulatory Visit | Attending: Internal Medicine | Admitting: Internal Medicine

## 2022-05-18 DIAGNOSIS — Z1231 Encounter for screening mammogram for malignant neoplasm of breast: Secondary | ICD-10-CM | POA: Diagnosis not present

## 2022-08-24 DIAGNOSIS — Z23 Encounter for immunization: Secondary | ICD-10-CM | POA: Diagnosis not present

## 2022-09-30 DIAGNOSIS — I10 Essential (primary) hypertension: Secondary | ICD-10-CM | POA: Diagnosis not present

## 2022-09-30 DIAGNOSIS — R7303 Prediabetes: Secondary | ICD-10-CM | POA: Diagnosis not present

## 2022-09-30 DIAGNOSIS — D649 Anemia, unspecified: Secondary | ICD-10-CM | POA: Diagnosis not present

## 2022-09-30 DIAGNOSIS — E7849 Other hyperlipidemia: Secondary | ICD-10-CM | POA: Diagnosis not present

## 2022-09-30 DIAGNOSIS — E034 Atrophy of thyroid (acquired): Secondary | ICD-10-CM | POA: Diagnosis not present

## 2022-10-07 DIAGNOSIS — E039 Hypothyroidism, unspecified: Secondary | ICD-10-CM | POA: Diagnosis not present

## 2022-10-07 DIAGNOSIS — M5136 Other intervertebral disc degeneration, lumbar region: Secondary | ICD-10-CM | POA: Diagnosis not present

## 2022-10-07 DIAGNOSIS — I10 Essential (primary) hypertension: Secondary | ICD-10-CM | POA: Diagnosis not present

## 2022-10-07 DIAGNOSIS — E785 Hyperlipidemia, unspecified: Secondary | ICD-10-CM | POA: Diagnosis not present

## 2022-10-07 DIAGNOSIS — Z1331 Encounter for screening for depression: Secondary | ICD-10-CM | POA: Diagnosis not present

## 2022-10-07 DIAGNOSIS — D509 Iron deficiency anemia, unspecified: Secondary | ICD-10-CM | POA: Diagnosis not present

## 2022-10-07 DIAGNOSIS — Z0000 Encounter for general adult medical examination without abnormal findings: Secondary | ICD-10-CM | POA: Diagnosis not present

## 2022-10-07 DIAGNOSIS — K219 Gastro-esophageal reflux disease without esophagitis: Secondary | ICD-10-CM | POA: Diagnosis not present

## 2022-10-07 DIAGNOSIS — R7303 Prediabetes: Secondary | ICD-10-CM | POA: Diagnosis not present

## 2022-11-10 DIAGNOSIS — E034 Atrophy of thyroid (acquired): Secondary | ICD-10-CM | POA: Diagnosis not present

## 2022-11-10 DIAGNOSIS — M8588 Other specified disorders of bone density and structure, other site: Secondary | ICD-10-CM | POA: Diagnosis not present

## 2023-04-20 DIAGNOSIS — R7303 Prediabetes: Secondary | ICD-10-CM | POA: Diagnosis not present

## 2023-04-20 DIAGNOSIS — E034 Atrophy of thyroid (acquired): Secondary | ICD-10-CM | POA: Diagnosis not present

## 2023-04-20 DIAGNOSIS — E7849 Other hyperlipidemia: Secondary | ICD-10-CM | POA: Diagnosis not present

## 2023-04-20 DIAGNOSIS — D649 Anemia, unspecified: Secondary | ICD-10-CM | POA: Diagnosis not present

## 2023-04-26 DIAGNOSIS — E785 Hyperlipidemia, unspecified: Secondary | ICD-10-CM | POA: Diagnosis not present

## 2023-04-26 DIAGNOSIS — E039 Hypothyroidism, unspecified: Secondary | ICD-10-CM | POA: Diagnosis not present

## 2023-04-26 DIAGNOSIS — M519 Unspecified thoracic, thoracolumbar and lumbosacral intervertebral disc disorder: Secondary | ICD-10-CM | POA: Diagnosis not present

## 2023-04-26 DIAGNOSIS — R7303 Prediabetes: Secondary | ICD-10-CM | POA: Diagnosis not present

## 2023-04-26 DIAGNOSIS — I10 Essential (primary) hypertension: Secondary | ICD-10-CM | POA: Diagnosis not present

## 2023-04-26 DIAGNOSIS — D649 Anemia, unspecified: Secondary | ICD-10-CM | POA: Diagnosis not present

## 2023-04-26 DIAGNOSIS — K219 Gastro-esophageal reflux disease without esophagitis: Secondary | ICD-10-CM | POA: Diagnosis not present

## 2023-05-12 DIAGNOSIS — H903 Sensorineural hearing loss, bilateral: Secondary | ICD-10-CM | POA: Diagnosis not present

## 2023-05-20 DIAGNOSIS — H903 Sensorineural hearing loss, bilateral: Secondary | ICD-10-CM | POA: Diagnosis not present

## 2023-06-02 ENCOUNTER — Other Ambulatory Visit: Payer: Self-pay | Admitting: Internal Medicine

## 2023-06-02 DIAGNOSIS — Z1231 Encounter for screening mammogram for malignant neoplasm of breast: Secondary | ICD-10-CM

## 2023-06-14 ENCOUNTER — Ambulatory Visit
Admission: RE | Admit: 2023-06-14 | Discharge: 2023-06-14 | Disposition: A | Discharge status: Home / Self Care | Payer: Medicare HMO | Source: Ambulatory Visit | Attending: Internal Medicine | Admitting: Internal Medicine

## 2023-06-14 DIAGNOSIS — Z1231 Encounter for screening mammogram for malignant neoplasm of breast: Secondary | ICD-10-CM | POA: Insufficient documentation

## 2023-07-03 ENCOUNTER — Ambulatory Visit: Payer: Medicare HMO | Admitting: Podiatry

## 2023-07-03 ENCOUNTER — Encounter: Payer: Self-pay | Admitting: Podiatry

## 2023-07-03 DIAGNOSIS — L603 Nail dystrophy: Secondary | ICD-10-CM

## 2023-07-03 DIAGNOSIS — E785 Hyperlipidemia, unspecified: Secondary | ICD-10-CM | POA: Insufficient documentation

## 2023-07-03 DIAGNOSIS — D649 Anemia, unspecified: Secondary | ICD-10-CM | POA: Insufficient documentation

## 2023-07-03 DIAGNOSIS — M1990 Unspecified osteoarthritis, unspecified site: Secondary | ICD-10-CM | POA: Insufficient documentation

## 2023-07-03 MED ORDER — NEOMYCIN-POLYMYXIN-HC 1 % OT SOLN: OTIC | 1 refills | Status: DC

## 2023-07-03 NOTE — Patient Instructions (Signed)

## 2023-07-03 NOTE — Progress Notes (Signed)
Subjective:  Patient ID: Susan Khan, female    DOB: 1941/06/17,  MRN: [id number] HPI Chief Complaint  Patient presents with   Nail Problem    Hallux right - toenail thick, discolored nail x years, no injury, took the left big toenail off years ago   New Patient (Initial Visit)    82 y.o. female presents with the above complaint.   ROS: Denies fever chills nausea vomiting muscle aches pains calf pain back pain chest pain shortness of breath.  Past Medical History:  Diagnosis Date   Arthritis    GERD (gastroesophageal reflux disease)    History of hiatal hernia    History of kidney stones    Hypertension    Hypothyroidism    Past Surgical History:  Procedure Laterality Date   CHOLECYSTECTOMY  01/13/2006   2008   FOOT SURGERY  12 years ago   HERNIA REPAIR     JOINT REPLACEMENT Left 01/13/2017   REVERSE SHOULDER ARTHROPLASTY Right 01/20/2017   Procedure: REVERSE RIGHT SHOULDER ARTHROPLASTY;  Surgeon: Beverely Low, MD;  Location: [facility] OR;  Service: Orthopedics;  Laterality: Right;    Current Outpatient Medications:    chlorthalidone (HYGROTON) 25 MG tablet, Take 1 tablet by mouth daily., Disp: , Rfl:    NEOMYCIN-POLYMYXIN-HYDROCORTISONE (CORTISPORIN) 1 % SOLN OTIC solution, Apply 1-2 drops to toe BID after soaking, Disp: 10 mL, Rfl: 1   acetaminophen (TYLENOL) 500 MG tablet, Take 500 mg by mouth 2 (two) times daily., Disp: , Rfl:    Artificial Tear Solution (SOOTHE XP) SOLN, Apply 1 drop to eye daily as needed (dry eyes)., Disp: , Rfl:    Calcium Citrate-Vitamin D (CITRACAL + D PO), Take 1 tablet by mouth daily., Disp: , Rfl:    cholecalciferol (VITAMIN D) 1000 units tablet, Take 1,000 Units by mouth daily., Disp: , Rfl:    HYDROcodone-acetaminophen (NORCO) 5-325 MG tablet, Take 1-2 tablets by mouth every 4 (four) hours as needed for moderate pain., Disp: 40 tablet, Rfl: 0   levothyroxine (SYNTHROID, LEVOTHROID) 75 MCG tablet, Take 75 mcg by mouth daily before breakfast.,  Disp: , Rfl:    losartan (COZAAR) 25 MG tablet, Take 25 mg by mouth at bedtime., Disp: , Rfl:    lovastatin (MEVACOR) 40 MG tablet, Take 40 mg by mouth at bedtime., Disp: , Rfl:    Multiple Vitamin (MULTIVITAMIN WITH MINERALS) TABS tablet, Take 1 tablet by mouth daily. For Women, Disp: , Rfl:    Naphazoline-Pheniramine (OPCON-A) 0.027-0.315 % SOLN, Apply 1 drop to eye daily as needed (allergies)., Disp: , Rfl:    omeprazole (PRILOSEC) 20 MG capsule, Take 20 mg by mouth daily as needed (heartburn). , Disp: , Rfl:   Allergies  Allergen Reactions   No Known Allergies    Review of Systems Objective:  There were no vitals filed for this visit.  General: Well developed, nourished, in no acute distress, alert and oriented x3   Dermatological: Skin is warm, dry and supple bilateral. Nails x 10 are well maintained; remaining integument appears unremarkable at this time. There are no open sores, no preulcerative lesions, no rash or signs of infection present.  Severe hallux nail dystrophy right.  Vascular: Dorsalis Pedis artery and Posterior Tibial artery pedal pulses are 2/4 bilateral with immedate capillary fill time. Pedal hair growth present. No varicosities and no lower extremity edema present bilateral.   Neruologic: Grossly intact via light touch bilateral. Vibratory intact via tuning fork bilateral. Protective threshold with Semmes Wienstein monofilament intact to  all pedal sites bilateral. Patellar and Achilles deep tendon reflexes 2+ bilateral. No Babinski or clonus noted bilateral.   Musculoskeletal: No gross boney pedal deformities bilateral. No pain, crepitus, or limitation noted with foot and ankle range of motion bilateral. Muscular strength 5/5 in all groups tested bilateral.  Gait: Unassisted, Nonantalgic.    Radiographs:  None taken  Assessment & Plan:   Assessment: Painful toenail hallux right.  Plan: Total nail avulsion with matrixectomy hallux right.  Tolerated  procedure well without complications.  She was given both oral and written home-going structure for the care and soaking of the toe as well as a prescription for Cortisporin Otic to be applied twice daily after soaking.  I will follow-up with her in 2 weeks.     Saralee Bolick T. [place], [place]

## 2023-07-17 ENCOUNTER — Ambulatory Visit (INDEPENDENT_AMBULATORY_CARE_PROVIDER_SITE_OTHER): Payer: Medicare HMO | Admitting: Podiatry

## 2023-07-17 ENCOUNTER — Encounter: Payer: Self-pay | Admitting: Podiatry

## 2023-07-17 DIAGNOSIS — L603 Nail dystrophy: Secondary | ICD-10-CM

## 2023-07-17 NOTE — Progress Notes (Signed)
She presents today for follow-up of her nail avulsion hallux right states is doing good still soaking and putting bandage on a daily believing that open at bedtime.  Objective: Vital signs are stable alert oriented x 3.  There is no erythema edema cellulitis drainage or odor some granulation tissue is present and some mild fibrous tissue detected as well.  Assessment: Well-healing nail avulsion hallux right.  Plan: Continue current therapies until completely resolved.  Should her pain worsen to come more red purulent she will notify us immediately.

## 2023-07-31 DIAGNOSIS — H524 Presbyopia: Secondary | ICD-10-CM | POA: Diagnosis not present

## 2023-07-31 DIAGNOSIS — H25813 Combined forms of age-related cataract, bilateral: Secondary | ICD-10-CM | POA: Diagnosis not present

## 2023-09-13 DIAGNOSIS — Z23 Encounter for immunization: Secondary | ICD-10-CM | POA: Diagnosis not present

## 2023-10-06 DIAGNOSIS — E7849 Other hyperlipidemia: Secondary | ICD-10-CM | POA: Diagnosis not present

## 2023-10-06 DIAGNOSIS — I10 Essential (primary) hypertension: Secondary | ICD-10-CM | POA: Diagnosis not present

## 2023-10-06 DIAGNOSIS — R7303 Prediabetes: Secondary | ICD-10-CM | POA: Diagnosis not present

## 2023-10-06 DIAGNOSIS — D649 Anemia, unspecified: Secondary | ICD-10-CM | POA: Diagnosis not present

## 2023-10-06 DIAGNOSIS — E034 Atrophy of thyroid (acquired): Secondary | ICD-10-CM | POA: Diagnosis not present

## 2023-10-13 DIAGNOSIS — D649 Anemia, unspecified: Secondary | ICD-10-CM | POA: Diagnosis not present

## 2023-10-13 DIAGNOSIS — K219 Gastro-esophageal reflux disease without esophagitis: Secondary | ICD-10-CM | POA: Diagnosis not present

## 2023-10-13 DIAGNOSIS — R7303 Prediabetes: Secondary | ICD-10-CM | POA: Diagnosis not present

## 2023-10-13 DIAGNOSIS — Z1331 Encounter for screening for depression: Secondary | ICD-10-CM | POA: Diagnosis not present

## 2023-10-13 DIAGNOSIS — E039 Hypothyroidism, unspecified: Secondary | ICD-10-CM | POA: Diagnosis not present

## 2023-10-13 DIAGNOSIS — M51362 Other intervertebral disc degeneration, lumbar region with discogenic back pain and lower extremity pain: Secondary | ICD-10-CM | POA: Diagnosis not present

## 2023-10-13 DIAGNOSIS — I10 Essential (primary) hypertension: Secondary | ICD-10-CM | POA: Diagnosis not present

## 2023-10-13 DIAGNOSIS — E785 Hyperlipidemia, unspecified: Secondary | ICD-10-CM | POA: Diagnosis not present

## 2023-10-13 DIAGNOSIS — Z0000 Encounter for general adult medical examination without abnormal findings: Secondary | ICD-10-CM | POA: Diagnosis not present

## 2023-11-01 DIAGNOSIS — E034 Atrophy of thyroid (acquired): Secondary | ICD-10-CM | POA: Diagnosis not present

## 2023-11-20 ENCOUNTER — Ambulatory Visit: Payer: Medicare HMO | Admitting: Podiatry

## 2023-11-20 ENCOUNTER — Encounter: Payer: Self-pay | Admitting: Podiatry

## 2023-11-20 DIAGNOSIS — L03031 Cellulitis of right toe: Secondary | ICD-10-CM

## 2023-11-20 DIAGNOSIS — R21 Rash and other nonspecific skin eruption: Secondary | ICD-10-CM | POA: Diagnosis not present

## 2023-11-20 MED ORDER — SULFAMETHOXAZOLE-TRIMETHOPRIM 800-160 MG PO TABS: 1.0000 | ORAL_TABLET | Freq: Two times a day (BID) | ORAL | 1 refills | Status: AC

## 2023-11-20 NOTE — Progress Notes (Signed)
She presents today after having not seen her since September stating that her nail avulsion with chemical matricectomy is still oozing a little bit.  She is referring to the hallux nail bed right.  She states that she still soaks that occasionally it does not hurt but is just not healed over well.  Objective: Vital signs are stable oriented x 3 there is no erythema cellulitis or odor.  She does have some gray sticky drainage that appears to be fibrin along the proximal nail fold.  There is no purulence per se.  It is nontender with debridement.  Assessment mild paronychia proximal nail fold possibly long-term secondary to matrixectomy.  Plan: Put her on 10 days of Bactrim DS and soak Epsom salts and warm water every other day.  She is to leave it open and allow it to dry out.  I will follow-up with her in a couple of weeks.  If not improved x-ray and possible MRI will be necessary.

## 2023-12-04 ENCOUNTER — Encounter: Payer: Self-pay | Admitting: Podiatry

## 2023-12-04 ENCOUNTER — Ambulatory Visit: Payer: Medicare HMO | Admitting: Podiatry

## 2023-12-04 DIAGNOSIS — L03031 Cellulitis of right toe: Secondary | ICD-10-CM

## 2023-12-04 MED ORDER — NEOMYCIN-POLYMYXIN-HC 1 % OT SOLN
OTIC | 1 refills | Status: DC
Start: 2023-12-04 — End: 2023-12-07

## 2023-12-04 NOTE — Progress Notes (Signed)
 Presents today for follow-up of her paronychia states that is finally doing good no problems whatsoever.  Objective: Only a small proximal area remains right at the proximal nail fold.  There is granulation tissue epithelialization is occurring does not appear to be infected appears to be healing slowly.  Assessment: Well-healing surgical toe hallux right very slow.  Plan: Recommended that she continue to soak Epsom salts warm water  every other day and to apply Cortisporin Otic.  Follow-up with her should this not going to heal in the next few weeks

## 2023-12-06 ENCOUNTER — Telehealth: Payer: Self-pay | Admitting: Podiatry

## 2023-12-06 NOTE — Telephone Encounter (Signed)
Patient had RX sent in on 2/10 it was sent to [facility] they want it sent to [facility].

## 2023-12-06 NOTE — Telephone Encounter (Signed)
Pt wants her RX from 2/10 sent to [facility] instead of [facility].

## 2023-12-07 ENCOUNTER — Other Ambulatory Visit: Payer: Self-pay

## 2023-12-07 ENCOUNTER — Telehealth: Payer: Self-pay | Admitting: Podiatry

## 2023-12-07 MED ORDER — NEOMYCIN-POLYMYXIN-HC 1 % OT SOLN: OTIC | 1 refills | Status: AC

## 2023-12-07 NOTE — Telephone Encounter (Signed)
Patient called again today he wants RX sent to [facility] not [facility] for his wifes RX from Dr Al Corpus.

## 2023-12-07 NOTE — Telephone Encounter (Signed)
Called pt let them know RX was sent over.

## 2024-04-11 DIAGNOSIS — E7849 Other hyperlipidemia: Secondary | ICD-10-CM | POA: Diagnosis not present

## 2024-04-11 DIAGNOSIS — D649 Anemia, unspecified: Secondary | ICD-10-CM | POA: Diagnosis not present

## 2024-04-11 DIAGNOSIS — R7303 Prediabetes: Secondary | ICD-10-CM | POA: Diagnosis not present

## 2024-04-11 DIAGNOSIS — E034 Atrophy of thyroid (acquired): Secondary | ICD-10-CM | POA: Diagnosis not present

## 2024-04-18 DIAGNOSIS — M5431 Sciatica, right side: Secondary | ICD-10-CM | POA: Diagnosis not present

## 2024-04-18 DIAGNOSIS — Z6837 Body mass index (BMI) 37.0-37.9, adult: Secondary | ICD-10-CM | POA: Diagnosis not present

## 2024-04-18 DIAGNOSIS — I10 Essential (primary) hypertension: Secondary | ICD-10-CM | POA: Diagnosis not present

## 2024-04-18 DIAGNOSIS — E785 Hyperlipidemia, unspecified: Secondary | ICD-10-CM | POA: Diagnosis not present

## 2024-04-18 DIAGNOSIS — E039 Hypothyroidism, unspecified: Secondary | ICD-10-CM | POA: Diagnosis not present

## 2024-04-18 DIAGNOSIS — R7303 Prediabetes: Secondary | ICD-10-CM | POA: Diagnosis not present

## 2024-04-23 DIAGNOSIS — M1611 Unilateral primary osteoarthritis, right hip: Secondary | ICD-10-CM | POA: Diagnosis not present

## 2024-04-23 DIAGNOSIS — M4807 Spinal stenosis, lumbosacral region: Secondary | ICD-10-CM | POA: Diagnosis not present

## 2024-04-23 DIAGNOSIS — M48062 Spinal stenosis, lumbar region with neurogenic claudication: Secondary | ICD-10-CM | POA: Diagnosis not present

## 2024-04-23 DIAGNOSIS — M47816 Spondylosis without myelopathy or radiculopathy, lumbar region: Secondary | ICD-10-CM | POA: Diagnosis not present

## 2024-04-24 ENCOUNTER — Other Ambulatory Visit: Payer: Self-pay | Admitting: Family Medicine

## 2024-04-24 DIAGNOSIS — M5416 Radiculopathy, lumbar region: Secondary | ICD-10-CM

## 2024-05-03 DIAGNOSIS — M47816 Spondylosis without myelopathy or radiculopathy, lumbar region: Secondary | ICD-10-CM | POA: Diagnosis not present

## 2024-05-03 DIAGNOSIS — M1611 Unilateral primary osteoarthritis, right hip: Secondary | ICD-10-CM | POA: Diagnosis not present

## 2024-05-03 DIAGNOSIS — M48062 Spinal stenosis, lumbar region with neurogenic claudication: Secondary | ICD-10-CM | POA: Diagnosis not present

## 2024-05-03 DIAGNOSIS — M545 Low back pain, unspecified: Secondary | ICD-10-CM | POA: Diagnosis not present

## 2024-05-07 ENCOUNTER — Ambulatory Visit
Admission: RE | Admit: 2024-05-07 | Discharge: 2024-05-07 | Disposition: A | Discharge status: Home / Self Care | Source: Ambulatory Visit | Attending: Family Medicine | Admitting: Family Medicine

## 2024-05-07 DIAGNOSIS — M4726 Other spondylosis with radiculopathy, lumbar region: Secondary | ICD-10-CM | POA: Diagnosis not present

## 2024-05-07 DIAGNOSIS — M5416 Radiculopathy, lumbar region: Secondary | ICD-10-CM | POA: Diagnosis not present

## 2024-05-07 DIAGNOSIS — M51369 Other intervertebral disc degeneration, lumbar region without mention of lumbar back pain or lower extremity pain: Secondary | ICD-10-CM | POA: Diagnosis not present

## 2024-05-14 DIAGNOSIS — M4807 Spinal stenosis, lumbosacral region: Secondary | ICD-10-CM | POA: Diagnosis not present

## 2024-05-14 DIAGNOSIS — M1611 Unilateral primary osteoarthritis, right hip: Secondary | ICD-10-CM | POA: Diagnosis not present

## 2024-05-14 DIAGNOSIS — M48062 Spinal stenosis, lumbar region with neurogenic claudication: Secondary | ICD-10-CM | POA: Diagnosis not present

## 2024-05-14 DIAGNOSIS — M47816 Spondylosis without myelopathy or radiculopathy, lumbar region: Secondary | ICD-10-CM | POA: Diagnosis not present

## 2024-05-16 ENCOUNTER — Other Ambulatory Visit: Payer: Self-pay | Admitting: Internal Medicine

## 2024-05-16 DIAGNOSIS — Z1231 Encounter for screening mammogram for malignant neoplasm of breast: Secondary | ICD-10-CM

## 2024-05-29 DIAGNOSIS — M48062 Spinal stenosis, lumbar region with neurogenic claudication: Secondary | ICD-10-CM | POA: Diagnosis not present

## 2024-05-29 DIAGNOSIS — M5416 Radiculopathy, lumbar region: Secondary | ICD-10-CM | POA: Diagnosis not present

## 2024-06-14 ENCOUNTER — Ambulatory Visit
Admission: RE | Admit: 2024-06-14 | Discharge: 2024-06-14 | Disposition: A | Discharge status: Home / Self Care | Source: Ambulatory Visit | Attending: Internal Medicine | Admitting: Internal Medicine

## 2024-06-14 DIAGNOSIS — Z1231 Encounter for screening mammogram for malignant neoplasm of breast: Secondary | ICD-10-CM | POA: Insufficient documentation

## 2024-06-19 DIAGNOSIS — M5416 Radiculopathy, lumbar region: Secondary | ICD-10-CM | POA: Diagnosis not present

## 2024-06-19 DIAGNOSIS — M48062 Spinal stenosis, lumbar region with neurogenic claudication: Secondary | ICD-10-CM | POA: Diagnosis not present

## 2024-08-12 DIAGNOSIS — H25813 Combined forms of age-related cataract, bilateral: Secondary | ICD-10-CM | POA: Diagnosis not present

## 2024-09-06 DIAGNOSIS — M48062 Spinal stenosis, lumbar region with neurogenic claudication: Secondary | ICD-10-CM | POA: Diagnosis not present

## 2024-09-06 DIAGNOSIS — M5416 Radiculopathy, lumbar region: Secondary | ICD-10-CM | POA: Diagnosis not present
